# Patient Record
Sex: Male | Born: 1960 | Race: White | Hispanic: No | Smoking: Former smoker
Health system: Southern US, Community
[De-identification: ages and names within clinical notes are randomized; demographics above are authoritative.]

## PROBLEM LIST (undated history)

## (undated) DIAGNOSIS — I639 Cerebral infarction, unspecified: Secondary | ICD-10-CM

## (undated) DIAGNOSIS — K219 Gastro-esophageal reflux disease without esophagitis: Secondary | ICD-10-CM

---

## 2005-08-23 ENCOUNTER — Encounter: Admission: RE | Admit: 2005-08-23 | Discharge: 2005-08-23 | Payer: Self-pay | Admitting: Family Medicine

## 2011-02-09 ENCOUNTER — Ambulatory Visit (INDEPENDENT_AMBULATORY_CARE_PROVIDER_SITE_OTHER): Payer: Self-pay

## 2011-02-09 DIAGNOSIS — J019 Acute sinusitis, unspecified: Secondary | ICD-10-CM

## 2011-02-26 ENCOUNTER — Ambulatory Visit: Payer: Self-pay | Admitting: Family Medicine

## 2011-02-26 VITALS — BP 110/67 | HR 81 | Temp 97.9°F | Resp 16 | Ht 70.75 in | Wt 205.2 lb

## 2011-02-26 DIAGNOSIS — M109 Gout, unspecified: Secondary | ICD-10-CM

## 2011-02-26 LAB — COMPREHENSIVE METABOLIC PANEL
Albumin: 4.4 g/dL (ref 3.5–5.2)
Alkaline Phosphatase: 60 U/L (ref 39–117)
BUN: 18 mg/dL (ref 6–23)
Calcium: 10.1 mg/dL (ref 8.4–10.5)
Chloride: 103 mEq/L (ref 96–112)
Glucose, Bld: 97 mg/dL (ref 70–99)
Potassium: 4.9 mEq/L (ref 3.5–5.3)
Sodium: 140 mEq/L (ref 135–145)
Total Protein: 7.4 g/dL (ref 6.0–8.3)

## 2011-02-26 MED ORDER — COLCHICINE 0.6 MG PO TABS: ORAL_TABLET | ORAL | Status: DC

## 2011-02-26 NOTE — Progress Notes (Signed)
  Subjective:    Patient ID: Peter Ellis, male    DOB: 1960/10/18, 51 y.o.   MRN: [id number]  HPI 51 year old white male, normal weight, works as a Curator. Yesterday he developed a cute painful swelling and redness in the right ankle, mostly anteriorly. This is the same as he has had with previous attacks of gout. He has been having gout since he was about 51 years old. He has been treated with Indocin in the past, and has had colchicine. However he had some GI bleeding after taking the Indocin this last time around, and was told that it was probably from that. He stopped the medication and his stomach has done fine since that time. He has not been on long-term antigout uric acid lowering medication.   Review of Systems no other complaints a brief review of systems     Objective:   Physical Exam  No acute distress except for the foot which is hurting him to it is obviously swollen in the ankle, from the lateral t to the medial  malleolus. He is read across the top and primarily proximal portion of his foot. Toes are not painful. The ankle an area of erythema are tender.      Assessment & Plan:  Acute gouty arthritis Indocin (NSAID) intolerant with GI upset and GI bleeding in history  We'll check labs on him will treat acutely with colchicine and prednisone. If the labs come back with a high uric acid like I suspect, we will put him on some allopurinol. He is to get back to me if he does not hear from me on that.

## 2011-02-26 NOTE — Patient Instructions (Signed)
Gout Gout is an inflammatory condition (arthritis) caused by a buildup of uric acid crystals in the joints. Uric acid is a chemical that is normally present in the blood. Under some circumstances, uric acid can form into crystals in your joints. This causes joint redness, soreness, and swelling (inflammation). Repeat attacks are common. Over time, uric acid crystals can form into masses (tophi) near a joint, causing disfigurement. Gout is treatable and often preventable. CAUSES  The disease begins with elevated levels of uric acid in the blood. Uric acid is produced by your body when it breaks down a naturally found substance called purines. This also happens when you eat certain foods such as meats and fish. Causes of an elevated uric acid level include:  Being passed down from parent to child (heredity).   Diseases that cause increased uric acid production (obesity, psoriasis, some cancers).   Excessive alcohol use.   Diet, especially diets rich in meat and seafood.   Medicines, including certain cancer-fighting drugs (chemotherapy), diuretics, and aspirin.   Chronic kidney disease. The kidneys are no longer able to remove uric acid well.   Problems with metabolism.  Conditions strongly associated with gout include:  Obesity.   High blood pressure.   High cholesterol.   Diabetes.  Not everyone with elevated uric acid levels gets gout. It is not understood why some people get gout and others do not. Surgery, joint injury, and eating too much of certain foods are some of the factors that can lead to gout. SYMPTOMS   An attack of gout comes on quickly. It causes intense pain with redness, swelling, and warmth in a joint.   Fever can occur.   Often, only one joint is involved. Certain joints are more commonly involved:   Base of the big toe.   Knee.   Ankle.   Wrist.   Finger.  Without treatment, an attack usually goes away in a few days to weeks. Between attacks, you  usually will not have symptoms, which is different from many other forms of arthritis. DIAGNOSIS  Your caregiver will suspect gout based on your symptoms and exam. Removal of fluid from the joint (arthrocentesis) is done to check for uric acid crystals. Your caregiver will give you a medicine that numbs the area (local anesthetic) and use a needle to remove joint fluid for exam. Gout is confirmed when uric acid crystals are seen in joint fluid, using a special microscope. Sometimes, blood, urine, and X-ray tests are also used. TREATMENT  There are 2 phases to gout treatment: treating the sudden onset (acute) attack and preventing attacks (prophylaxis). Treatment of an Acute Attack  Medicines are used. These include anti-inflammatory medicines or steroid medicines.   An injection of steroid medicine into the affected joint is sometimes necessary.   The painful joint is rested. Movement can worsen the arthritis.   You may use warm or cold treatments on painful joints, depending which works best for you.   Discuss the use of coffee, vitamin C, or cherries with your caregiver. These may be helpful treatment options.  Treatment to Prevent Attacks After the acute attack subsides, your caregiver may advise prophylactic medicine. These medicines either help your kidneys eliminate uric acid from your body or decrease your uric acid production. You may need to stay on these medicines for a very long time. The early phase of treatment with prophylactic medicine can be associated with an increase in acute gout attacks. For this reason, during the first few months   of treatment, your caregiver may also advise you to take medicines usually used for acute gout treatment. Be sure you understand your caregiver's directions. You should also discuss dietary treatment with your caregiver. Certain foods such as meats and fish can increase uric acid levels. Other foods such as dairy can decrease levels. Your caregiver  can give you a list of foods to avoid. HOME CARE INSTRUCTIONS   Do not take aspirin to relieve pain. This raises uric acid levels.   Only take over-the-counter or prescription medicines for pain, discomfort, or fever as directed by your caregiver.   Rest the joint as much as possible. When in bed, keep sheets and blankets off painful areas.   Keep the affected joint raised (elevated).   Use crutches if the painful joint is in your leg.   Drink enough water and fluids to keep your urine clear or pale yellow. This helps your body get rid of uric acid. Do not drink alcoholic beverages. They slow the passage of uric acid.   Follow your caregiver's dietary instructions. Pay careful attention to the amount of protein you eat. Your daily diet should emphasize fruits, vegetables, whole grains, and fat-free or low-fat milk products.   Maintain a healthy body weight.  SEEK MEDICAL CARE IF:   You have an oral temperature above 102 F (38.9 C).   You develop diarrhea, vomiting, or any side effects from medicines.   You do not feel better in 24 hours, or you are getting worse.  SEEK IMMEDIATE MEDICAL CARE IF:   Your joint becomes suddenly more tender and you have:   Chills.   An oral temperature above 102 F (38.9 C), not controlled by medicine.  MAKE SURE YOU:   Understand these instructions.   Will watch your condition.   Will get help right away if you are not doing well or get worse.  Document Released: 12/30/1999 Document Revised: 09/13/2010 Document Reviewed: 04/11/2009 [employer] Patient Information 2012 [employer]. 

## 2011-03-02 ENCOUNTER — Encounter: Payer: Self-pay | Admitting: Family Medicine

## 2011-03-27 ENCOUNTER — Ambulatory Visit: Payer: Self-pay | Admitting: Family Medicine

## 2011-03-27 DIAGNOSIS — M79609 Pain in unspecified limb: Secondary | ICD-10-CM

## 2011-03-27 DIAGNOSIS — M109 Gout, unspecified: Secondary | ICD-10-CM | POA: Insufficient documentation

## 2011-03-27 MED ORDER — ALLOPURINOL 100 MG PO TABS: 100.0000 mg | ORAL_TABLET | Freq: Every day | ORAL | Status: DC

## 2011-03-27 MED ORDER — PREDNISONE 20 MG PO TABS: ORAL_TABLET | ORAL | Status: DC

## 2011-03-27 NOTE — Patient Instructions (Signed)
Gout Gout is an inflammatory condition (arthritis) caused by a buildup of uric acid crystals in the joints. Uric acid is a chemical that is normally present in the blood. Under some circumstances, uric acid can form into crystals in your joints. This causes joint redness, soreness, and swelling (inflammation). Repeat attacks are common. Over time, uric acid crystals can form into masses (tophi) near a joint, causing disfigurement. Gout is treatable and often preventable. CAUSES  The disease begins with elevated levels of uric acid in the blood. Uric acid is produced by your body when it breaks down a naturally found substance called purines. This also happens when you eat certain foods such as meats and fish. Causes of an elevated uric acid level include:  Being passed down from parent to child (heredity).   Diseases that cause increased uric acid production (obesity, psoriasis, some cancers).   Excessive alcohol use.   Diet, especially diets rich in meat and seafood.   Medicines, including certain cancer-fighting drugs (chemotherapy), diuretics, and aspirin.   Chronic kidney disease. The kidneys are no longer able to remove uric acid well.   Problems with metabolism.  Conditions strongly associated with gout include:  Obesity.   High blood pressure.   High cholesterol.   Diabetes.  Not everyone with elevated uric acid levels gets gout. It is not understood why some people get gout and others do not. Surgery, joint injury, and eating too much of certain foods are some of the factors that can lead to gout. SYMPTOMS   An attack of gout comes on quickly. It causes intense pain with redness, swelling, and warmth in a joint.   Fever can occur.   Often, only one joint is involved. Certain joints are more commonly involved:   Base of the big toe.   Knee.   Ankle.   Wrist.   Finger.  Without treatment, an attack usually goes away in a few days to weeks. Between attacks, you  usually will not have symptoms, which is different from many other forms of arthritis. DIAGNOSIS  Your caregiver will suspect gout based on your symptoms and exam. Removal of fluid from the joint (arthrocentesis) is done to check for uric acid crystals. Your caregiver will give you a medicine that numbs the area (local anesthetic) and use a needle to remove joint fluid for exam. Gout is confirmed when uric acid crystals are seen in joint fluid, using a special microscope. Sometimes, blood, urine, and X-ray tests are also used. TREATMENT  There are 2 phases to gout treatment: treating the sudden onset (acute) attack and preventing attacks (prophylaxis). Treatment of an Acute Attack  Medicines are used. These include anti-inflammatory medicines or steroid medicines.   An injection of steroid medicine into the affected joint is sometimes necessary.   The painful joint is rested. Movement can worsen the arthritis.   You may use warm or cold treatments on painful joints, depending which works best for you.   Discuss the use of coffee, vitamin C, or cherries with your caregiver. These may be helpful treatment options.  Treatment to Prevent Attacks After the acute attack subsides, your caregiver may advise prophylactic medicine. These medicines either help your kidneys eliminate uric acid from your body or decrease your uric acid production. You may need to stay on these medicines for a very long time. The early phase of treatment with prophylactic medicine can be associated with an increase in acute gout attacks. For this reason, during the first few months   of treatment, your caregiver may also advise you to take medicines usually used for acute gout treatment. Be sure you understand your caregiver's directions. You should also discuss dietary treatment with your caregiver. Certain foods such as meats and fish can increase uric acid levels. Other foods such as dairy can decrease levels. Your caregiver  can give you a list of foods to avoid. HOME CARE INSTRUCTIONS   Do not take aspirin to relieve pain. This raises uric acid levels.   Only take over-the-counter or prescription medicines for pain, discomfort, or fever as directed by your caregiver.   Rest the joint as much as possible. When in bed, keep sheets and blankets off painful areas.   Keep the affected joint raised (elevated).   Use crutches if the painful joint is in your leg.   Drink enough water and fluids to keep your urine clear or pale yellow. This helps your body get rid of uric acid. Do not drink alcoholic beverages. They slow the passage of uric acid.   Follow your caregiver's dietary instructions. Pay careful attention to the amount of protein you eat. Your daily diet should emphasize fruits, vegetables, whole grains, and fat-free or low-fat milk products.   Maintain a healthy body weight.  SEEK MEDICAL CARE IF:   You have an oral temperature above 102 F (38.9 C).   You develop diarrhea, vomiting, or any side effects from medicines.   You do not feel better in 24 hours, or you are getting worse.  SEEK IMMEDIATE MEDICAL CARE IF:   Your joint becomes suddenly more tender and you have:   Chills.   An oral temperature above 102 F (38.9 C), not controlled by medicine.  MAKE SURE YOU:   Understand these instructions.   Will watch your condition.   Will get help right away if you are not doing well or get worse.  Document Released: 12/30/1999 Document Revised: 12/21/2010 Document Reviewed: 04/11/2009 [facility] Patient Information 2012 [place], [place].  If any dark stools, or abdominal pain- return to clinic or emergency room.  Recheck in 3 months.  Return to the clinic or go to the nearest emergency room if any of your symptoms worsen or new symptoms occur.

## 2011-03-27 NOTE — Progress Notes (Signed)
  Subjective:    Patient ID: Peter Ellis, male    DOB: Mar 23, 1960, 51 y.o.   MRN: [id number]  HPI Finis Hendricksen is a 51 y.o. male Hx Gout x 20 years.  Last attack 1 month ago, took colcrys then  - caused upset stomach, and then dark stools.  Hx of GI bleed with Indocin.  Took colcrys last week as well and noticed dark stools, but stools normal. Has been taking 2 at 1st dose, then 2 more later in day, then 1 following day.  Does take pepto bismol with Colcrys. (has taken pepto each time the stools are black).  Would like different med than colcrys due to GI intolerance.  Has taken prednisone in past without difficulty. Uric acid 8.3 02/27/11.  5 or 6 gout attacks this year, even with diet changes.   Current sx's since last night.  R foot/ankle sore and slightly swollen since last night.  Not as bad as in past, but feels typical of gout symptoms. Tx: none  Glucose WNL a last ov.  Review of Systems  Constitutional: Negative for fever and chills.  Gastrointestinal: Negative for abdominal pain, blood in stool and anal bleeding.  Musculoskeletal: Positive for arthralgias.        Objective:   Physical Exam  Constitutional: He is oriented to person, place, and time. He appears well-developed and well-nourished.  HENT:  Head: Normocephalic and atraumatic.  Cardiovascular: Normal rate, regular rhythm, normal heart sounds and intact distal pulses.   Pulmonary/Chest: Effort normal.  Abdominal: Soft. There is no tenderness.  Musculoskeletal: He exhibits edema.       Right ankle: He exhibits decreased range of motion and swelling. He exhibits no ecchymosis and no deformity. no tenderness.  Neurological: He is alert and oriented to person, place, and time.  Skin: Skin is warm. There is erythema.       Faint erythema dorsum R ankle only.   Psychiatric: He has a normal mood and affect. His behavior is normal.          Assessment & Plan:  Peter Ellis is a 51 y.o. male Gout- recurrent  with increased attack frequency.  Possible GI bleed with Indocin, but suspect dark stools with pepto bismol use when taking colcrys, not true GIB with colcrys.  However, does have significant Gi distress with colcrys.  Start prednisone 20 mg 3, 3,2,2, 1, 1, 1/2, 1/2.  SED.  RTC if any dark stools or abd pain. In 2 weeks, can start Allopurinol - SED/precautions reviewed.  RTC next 3 months for recheck uric acid.

## 2011-04-06 ENCOUNTER — Other Ambulatory Visit: Payer: Self-pay | Admitting: Physician Assistant

## 2011-04-06 ENCOUNTER — Ambulatory Visit: Payer: Self-pay | Admitting: Emergency Medicine

## 2011-04-06 ENCOUNTER — Ambulatory Visit: Payer: Self-pay

## 2011-04-06 VITALS — BP 118/66 | HR 75 | Temp 98.5°F | Resp 18 | Ht 71.0 in | Wt 208.0 lb

## 2011-04-06 DIAGNOSIS — M109 Gout, unspecified: Secondary | ICD-10-CM

## 2011-04-06 DIAGNOSIS — L02419 Cutaneous abscess of limb, unspecified: Secondary | ICD-10-CM

## 2011-04-06 DIAGNOSIS — M25569 Pain in unspecified knee: Secondary | ICD-10-CM

## 2011-04-06 DIAGNOSIS — M25469 Effusion, unspecified knee: Secondary | ICD-10-CM

## 2011-04-06 DIAGNOSIS — L03119 Cellulitis of unspecified part of limb: Secondary | ICD-10-CM

## 2011-04-06 MED ORDER — DOXYCYCLINE HYCLATE 100 MG PO CAPS: 100.0000 mg | ORAL_CAPSULE | Freq: Two times a day (BID) | ORAL | Status: AC

## 2011-04-06 MED ORDER — HYDROCODONE-ACETAMINOPHEN 5-325 MG PO TABS: 1.0000 | ORAL_TABLET | Freq: Four times a day (QID) | ORAL | Status: AC | PRN

## 2011-04-06 MED ORDER — MELOXICAM 7.5 MG PO TABS: 7.5000 mg | ORAL_TABLET | Freq: Two times a day (BID) | ORAL | Status: DC

## 2011-04-06 NOTE — Patient Instructions (Addendum)
Hold off on the Colrys while you take the meloxicam.  We will contact you with the lab results.  If your pain, swelling worsen, or if you develop fever, redness at the site of the needle, return for re-evaluation promptly.

## 2011-04-06 NOTE — Progress Notes (Signed)
  Subjective:    Patient ID: Peter Peter, male    DOB: Jun 01, 1960, 51 y.o.   MRN: [id number]  HPI  Air squats on Wednesday night.  Awoke Thursday with left knee pain and swelling.  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  Also, has been getting "boils" on his thighs for the past several months.  They drain pus and then resolve.  Has two currently on the right anterior distal thigh, a resolving lesion on the right upper posterior thigh. .  Review of Systems     Objective:   Physical Exam  VS noted.  Awake, alert, oriented, NAD.   Left knee is swollen, very tender, generalized.  Two large erythematous raised lesions (3 cm and 1.5 cm) on the right anterior distal thigh.  Tender.  Indurated with central fluctuance.Scab over each is lifted with the dull edge of a blade and purulence is expressed.    [facility] reading (PRIMARY) by  Dr. Cleta Peter. Small bone island noted in the medial tibial plateau. Joint space preserved.  See Dr. Ellis Peter note regarding aspiration of the knee effusion.  Patient was counselled extensively regarding the increased risk of infection given the probable MRSA infection on the other leg.     Assessment & Plan:  Knee pain, swelling with effusion, probable gout. Cellulitis/abscess, thigh.  Crutches, doxycycline, meloxicam, and Norco.  Await wound culture, and synovial fluid analysis (crystals, culture and gram stain).

## 2011-04-06 NOTE — Progress Notes (Signed)
  Subjective:    Patient ID: Peter Ellis, male    DOB: [birth year], 51 y.o.   MRN: [id number]  HPI  Review of Systems     Objective:   Physical Exam   [facility] reading (PRIMARY) by  Dr.Izekiel Flegel x-ray shows a bone island over the tibial plateau  The medial knee was prepped with Betadine and numbed with 2 cc of 2% plain 60 cc of cloudy fluid was removed. This was not bloody fluid sent for crystals and culture.     Assessment & Plan:

## 2011-04-07 LAB — SYNOVIAL FLUID, CRYSTAL

## 2011-04-09 LAB — WOUND CULTURE
Gram Stain: NONE SEEN
Gram Stain: NONE SEEN

## 2011-04-10 LAB — BODY FLUID CULTURE: Organism ID, Bacteria: NO GROWTH

## 2011-04-12 ENCOUNTER — Encounter: Payer: Self-pay | Admitting: Physician Assistant

## 2011-04-12 ENCOUNTER — Ambulatory Visit: Payer: BC Managed Care – PPO | Admitting: Physician Assistant

## 2011-04-12 VITALS — BP 140/62 | HR 80 | Temp 98.0°F | Resp 16 | Ht 71.0 in | Wt 210.0 lb

## 2011-04-12 DIAGNOSIS — R110 Nausea: Secondary | ICD-10-CM

## 2011-04-12 MED ORDER — PROMETHAZINE HCL 25 MG PO TABS: 25.0000 mg | ORAL_TABLET | Freq: Three times a day (TID) | ORAL | Status: DC | PRN

## 2011-04-12 NOTE — Patient Instructions (Signed)
Return to clinic if fever/chills or abdominal pain develop. If current symptoms don't resolve in 24-36 hrs, return to clinic.

## 2011-04-12 NOTE — Progress Notes (Signed)
  Subjective:    Patient ID: Peter Ellis, male    DOB: 14-Jan-1961, 51 y.o.   MRN: [id number]  HPI51yo CM c/o 2 episodes of diarrhea this morning and he has continued to feel nauseated all day without vomiting.  He did take some pepto bismol which seemed to help a little.  No f/c.  No abd. Pain.  No recent travel.    Review of Systems  All other systems reviewed and are negative.       Objective:   Physical Exam  Nursing note and vitals reviewed. Constitutional: He is oriented to person, place, and time. He appears well-developed and well-nourished.  HENT:  Head: Normocephalic and atraumatic.  Cardiovascular: Normal rate, regular rhythm and normal heart sounds.   Pulmonary/Chest: Effort normal and breath sounds normal.  Abdominal: Soft. He exhibits no distension and no mass. There is no tenderness. There is no rebound and no guarding.       Increased bowel sounds but sound normal.  Neurological: He is alert and oriented to person, place, and time.  Skin: Skin is warm and dry.          Assessment & Plan:  Likely viral syndrome-mild diarrhea and nausea w/o vomiting. Increase fluids, rest.  Phenergan sent to pharmacy.

## 2011-05-06 ENCOUNTER — Ambulatory Visit: Payer: BC Managed Care – PPO | Admitting: Emergency Medicine

## 2011-05-06 VITALS — BP 144/79 | HR 76 | Temp 98.5°F | Resp 16 | Ht 71.0 in | Wt 207.0 lb

## 2011-05-06 DIAGNOSIS — R1013 Epigastric pain: Secondary | ICD-10-CM

## 2011-05-06 MED ORDER — FLUTICASONE PROPIONATE 50 MCG/ACT NA SUSP: 2.0000 | Freq: Every day | NASAL | Status: DC

## 2011-05-06 MED ORDER — RANITIDINE HCL 150 MG PO TABS: 150.0000 mg | ORAL_TABLET | Freq: Two times a day (BID) | ORAL | Status: DC

## 2011-05-06 NOTE — Progress Notes (Signed)
  Subjective:    Patient ID: Peter Ellis, male    DOB: [birth year], 51 y.o.   MRN: [id number]  HPI patient states that approximately 3 weeks ago he started having difficulty with a lot of saliva building up in the back of his mouth. He felt a popping sensation in his years. He denies sore throat. He states he did not have any chest pain or shortness of breath or abdominal pain. He tried some Gaviscon without relief he is only history is one of gout. He states when he takes a colchicine it does bother his stomach he has been on anti-inflammatories in the past. He is currently on allopurinol.    Review of Systems review of systems is positive mainly for history of gout.     Objective:   Physical Exam HEENT exam is within normal limits the neck is supple chest is clear to auscultation and percussion. Heart regular rate no murmurs. Abdomen is soft liver and spleen not enlarged no tenderness or masses.  EKG shows a normal sinus rhythm. There is J-point elevation inferiorly.      Assessment & Plan:  Not quite sure how to put patient's symptoms together. We'll try Zantac twice a day. Also trial of Flonase 2 puffs each side of his nose once a day. This should stop any postnasal drip related to allergies. We'll make sure he stays off nonsteroidals or anything else that might irritate his stomach

## 2011-05-06 NOTE — Patient Instructions (Signed)
To stop all nonsteroidal drugs. Try the Zantac and Flonase. Please review the instructions regarding refluxGastroesophageal Reflux Disease, Adult Gastroesophageal reflux disease (GERD) happens when acid from your stomach flows up into the esophagus. When acid comes in contact with the esophagus, the acid causes soreness (inflammation) in the esophagus. Over time, GERD may create small holes (ulcers) in the lining of the esophagus. CAUSES   Increased body weight. This puts pressure on the stomach, making acid rise from the stomach into the esophagus.   Smoking. This increases acid production in the stomach.   Drinking alcohol. This causes decreased pressure in the lower esophageal sphincter (valve or ring of muscle between the esophagus and stomach), allowing acid from the stomach into the esophagus.   Late evening meals and a full stomach. This increases pressure and acid production in the stomach.   A malformed lower esophageal sphincter.  Sometimes, no cause is found. SYMPTOMS   Burning pain in the lower part of the mid-chest behind the breastbone and in the mid-stomach area. This may occur twice a week or more often.   Trouble swallowing.   Sore throat.   Dry cough.   Asthma-like symptoms including chest tightness, shortness of breath, or wheezing.  DIAGNOSIS  Your caregiver may be able to diagnose GERD based on your symptoms. In some cases, X-rays and other tests may be done to check for complications or to check the condition of your stomach and esophagus. TREATMENT  Your caregiver may recommend over-the-counter or prescription medicines to help decrease acid production. Ask your caregiver before starting or adding any new medicines.  HOME CARE INSTRUCTIONS   Change the factors that you can control. Ask your caregiver for guidance concerning weight loss, quitting smoking, and alcohol consumption.   Avoid foods and drinks that make your symptoms worse, such as:   Caffeine or  alcoholic drinks.   Chocolate.   Peppermint or mint flavorings.   Garlic and onions.   Spicy foods.   Citrus fruits, such as oranges, lemons, or limes.   Tomato-based foods such as sauce, chili, salsa, and pizza.   Fried and fatty foods.   Avoid lying down for the 3 hours prior to your bedtime or prior to taking a nap.   Eat small, frequent meals instead of large meals.   Wear loose-fitting clothing. Do not wear anything tight around your waist that causes pressure on your stomach.   Raise the head of your bed 6 to 8 inches with wood blocks to help you sleep. Extra pillows will not help.   Only take over-the-counter or prescription medicines for pain, discomfort, or fever as directed by your caregiver.   Do not take aspirin, ibuprofen, or other nonsteroidal anti-inflammatory drugs (NSAIDs).  SEEK IMMEDIATE MEDICAL CARE IF:   You have pain in your arms, neck, jaw, teeth, or back.   Your pain increases or changes in intensity or duration.   You develop nausea, vomiting, or sweating (diaphoresis).   You develop shortness of breath, or you faint.   Your vomit is green, yellow, black, or looks like coffee grounds or blood.   Your stool is red, bloody, or black.  These symptoms could be signs of other problems, such as heart disease, gastric bleeding, or esophageal bleeding. MAKE SURE YOU:   Understand these instructions.   Will watch your condition.   Will get help right away if you are not doing well or get worse.  Document Released: 10/11/2004 Document Revised: 12/21/2010 Document Reviewed: 07/21/2010 [employer]  Patient Information 2012 [employer]. 

## 2011-05-07 ENCOUNTER — Telehealth: Payer: Self-pay

## 2011-05-07 NOTE — Telephone Encounter (Signed)
Spoke with patient and let him know he should give the meds two to three days to work and if pains get worse to rtc.  Patient stated that he understood and would rtc if he gets worse.

## 2011-05-07 NOTE — Telephone Encounter (Signed)
Pt called and states was seen by dr Cleta Alberts, and prescribed zantac. Pt wants to let the dr know he took 1 at 3:00 yesterday and again at 8:30 yesterday, but is having stomach pains. Wants to know what to do. Please call pt to advise

## 2011-05-07 NOTE — Telephone Encounter (Signed)
It may take 2-3 days for the Zantac to work, he should take it twice daily as prescribed. This will help with mild gastritis/heartburn.   If the stomach pains worsen he should return to clinic.

## 2011-05-07 NOTE — Telephone Encounter (Signed)
Please advise on this.  

## 2011-05-11 ENCOUNTER — Ambulatory Visit: Payer: BC Managed Care – PPO | Admitting: Family Medicine

## 2011-05-11 VITALS — BP 135/67 | HR 81 | Temp 98.1°F | Resp 16 | Ht 70.5 in | Wt 204.8 lb

## 2011-05-11 DIAGNOSIS — K219 Gastro-esophageal reflux disease without esophagitis: Secondary | ICD-10-CM

## 2011-05-11 DIAGNOSIS — M109 Gout, unspecified: Secondary | ICD-10-CM

## 2011-05-11 MED ORDER — METHYLPREDNISOLONE ACETATE 80 MG/ML IJ SUSP
80.0000 mg | Freq: Once | INTRAMUSCULAR | Status: AC
  Administered 2011-05-11: 80 mg via INTRAMUSCULAR

## 2011-05-11 MED ORDER — PREDNISONE 20 MG PO TABS: ORAL_TABLET | ORAL | Status: DC

## 2011-05-11 NOTE — Patient Instructions (Signed)
Gout Gout is an inflammatory condition (arthritis) caused by a buildup of uric acid crystals in the joints. Uric acid is a chemical that is normally present in the blood. Under some circumstances, uric acid can form into crystals in your joints. This causes joint redness, soreness, and swelling (inflammation). Repeat attacks are common. Over time, uric acid crystals can form into masses (tophi) near a joint, causing disfigurement. Gout is treatable and often preventable. CAUSES  The disease begins with elevated levels of uric acid in the blood. Uric acid is produced by your body when it breaks down a naturally found substance called purines. This also happens when you eat certain foods such as meats and fish. Causes of an elevated uric acid level include:  Being passed down from parent to child (heredity).   Diseases that cause increased uric acid production (obesity, psoriasis, some cancers).   Excessive alcohol use.   Diet, especially diets rich in meat and seafood.   Medicines, including certain cancer-fighting drugs (chemotherapy), diuretics, and aspirin.   Chronic kidney disease. The kidneys are no longer able to remove uric acid well.   Problems with metabolism.  Conditions strongly associated with gout include:  Obesity.   High blood pressure.   High cholesterol.   Diabetes.  Not everyone with elevated uric acid levels gets gout. It is not understood why some people get gout and others do not. Surgery, joint injury, and eating too much of certain foods are some of the factors that can lead to gout. SYMPTOMS   An attack of gout comes on quickly. It causes intense pain with redness, swelling, and warmth in a joint.   Fever can occur.   Often, only one joint is involved. Certain joints are more commonly involved:   Base of the big toe.   Knee.   Ankle.   Wrist.   Finger.  Without treatment, an attack usually goes away in a few days to weeks. Between attacks, you  usually will not have symptoms, which is different from many other forms of arthritis. DIAGNOSIS  Your caregiver will suspect gout based on your symptoms and exam. Removal of fluid from the joint (arthrocentesis) is done to check for uric acid crystals. Your caregiver will give you a medicine that numbs the area (local anesthetic) and use a needle to remove joint fluid for exam. Gout is confirmed when uric acid crystals are seen in joint fluid, using a special microscope. Sometimes, blood, urine, and X-ray tests are also used. TREATMENT  There are 2 phases to gout treatment: treating the sudden onset (acute) attack and preventing attacks (prophylaxis). Treatment of an Acute Attack  Medicines are used. These include anti-inflammatory medicines or steroid medicines.   An injection of steroid medicine into the affected joint is sometimes necessary.   The painful joint is rested. Movement can worsen the arthritis.   You may use warm or cold treatments on painful joints, depending which works best for you.   Discuss the use of coffee, vitamin C, or cherries with your caregiver. These may be helpful treatment options.  Treatment to Prevent Attacks After the acute attack subsides, your caregiver may advise prophylactic medicine. These medicines either help your kidneys eliminate uric acid from your body or decrease your uric acid production. You may need to stay on these medicines for a very long time. The early phase of treatment with prophylactic medicine can be associated with an increase in acute gout attacks. For this reason, during the first few months   of treatment, your caregiver may also advise you to take medicines usually used for acute gout treatment. Be sure you understand your caregiver's directions. You should also discuss dietary treatment with your caregiver. Certain foods such as meats and fish can increase uric acid levels. Other foods such as dairy can decrease levels. Your caregiver  can give you a list of foods to avoid. HOME CARE INSTRUCTIONS   Do not take aspirin to relieve pain. This raises uric acid levels.   Only take over-the-counter or prescription medicines for pain, discomfort, or fever as directed by your caregiver.   Rest the joint as much as possible. When in bed, keep sheets and blankets off painful areas.   Keep the affected joint raised (elevated).   Use crutches if the painful joint is in your leg.   Drink enough water and fluids to keep your urine clear or pale yellow. This helps your body get rid of uric acid. Do not drink alcoholic beverages. They slow the passage of uric acid.   Follow your caregiver's dietary instructions. Pay careful attention to the amount of protein you eat. Your daily diet should emphasize fruits, vegetables, whole grains, and fat-free or low-fat milk products.   Maintain a healthy body weight.  SEEK MEDICAL CARE IF:   You have an oral temperature above 102 F (38.9 C).   You develop diarrhea, vomiting, or any side effects from medicines.   You do not feel better in 24 hours, or you are getting worse.  SEEK IMMEDIATE MEDICAL CARE IF:   Your joint becomes suddenly more tender and you have:   Chills.   An oral temperature above 102 F (38.9 C), not controlled by medicine.  MAKE SURE YOU:   Understand these instructions.   Will watch your condition.   Will get help right away if you are not doing well or get worse.  Document Released: 12/30/1999 Document Revised: 12/21/2010 Document Reviewed: 04/11/2009 [employer] Patient Information 2012 [employer]. 

## 2011-05-11 NOTE — Progress Notes (Signed)
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  He just recently got on the allopurinol. Left wrist began to swell and ache yesterday after some discomfort for a week.  The left knee has also started to flare in the past 24 hours.  Patient was seen last week for GERD.  O: Left wrist is very swollen as is the left knee Antalgic gait NAD  A:  Acute gouty flare in patient with sensitive stomach  P:  DepoMedrol 120 mg Prednisone taper.

## 2011-05-15 ENCOUNTER — Ambulatory Visit: Payer: BC Managed Care – PPO | Admitting: Family Medicine

## 2011-05-15 VITALS — BP 133/78 | HR 81 | Temp 98.1°F | Resp 18 | Ht 70.5 in | Wt 201.8 lb

## 2011-05-15 DIAGNOSIS — L0390 Cellulitis, unspecified: Secondary | ICD-10-CM

## 2011-05-15 DIAGNOSIS — M79609 Pain in unspecified limb: Secondary | ICD-10-CM

## 2011-05-15 DIAGNOSIS — H6690 Otitis media, unspecified, unspecified ear: Secondary | ICD-10-CM

## 2011-05-15 MED ORDER — DOXYCYCLINE HYCLATE 100 MG PO TABS: 100.0000 mg | ORAL_TABLET | Freq: Two times a day (BID) | ORAL | Status: AC

## 2011-05-15 NOTE — Progress Notes (Signed)
  Subjective:    Patient ID: Peter Ellis, male    DOB: 1960-11-29, 51 y.o.   MRN: [id number]  HPI    Review of Systems     Objective:   Physical Exam   Procedure Note:  VCO.  Left leg wound cleansed with betadine and alcohol.  2cc of 1% lidocaine with epinephrine injected and #11 blade used to open.  Moderate amount of yellow/red purulence expressed, irrigated with 3 cc of lidocaine and 1/4 inch packing (small piece) placed in wound.  Telfa, gauze, hypofix applied.  Pt tolerated well.  Peter Steig, PA-C     Assessment & Plan:  Abcess/celllulitis of left leg.  Wound instructions given pt.  Advised RTC in 2 days for wound care, fast pass given.  Peter Slough, PA-C

## 2011-05-15 NOTE — Progress Notes (Signed)
  Subjective:    Patient ID: Peter Ellis, male    DOB: 10-07-1960, 51 y.o.   MRN: [id number]  HPI 51 yo male with history of abscesses here with new abscess to left knee.  Last one month ago was MRSA. This one there about 7-10 days.  Was there when he was here for gout flare last week but didn't bring it up.  Was given steroid injection and since has worsened.  Feeling okay.   Review of Systems Negative except as per HPI     Objective:   Physical Exam  Constitutional: He appears well-developed.  Pulmonary/Chest: Effort normal.  Neurological: He is alert.  Skin:       Medial left knee - scabbed over fluctuant abscess with significant surrounding erythema.  TTP.  Wound culture obtained from drianing fluid          Assessment & Plan:  Abscess and cellulitis - opened per procedure note.  Doxy 100 BID for 10 days.

## 2011-05-17 ENCOUNTER — Ambulatory Visit (INDEPENDENT_AMBULATORY_CARE_PROVIDER_SITE_OTHER): Payer: BC Managed Care – PPO | Admitting: Internal Medicine

## 2011-05-17 VITALS — BP 117/72 | HR 73 | Temp 98.2°F | Resp 16 | Ht 70.5 in | Wt 200.0 lb

## 2011-05-17 DIAGNOSIS — L03119 Cellulitis of unspecified part of limb: Secondary | ICD-10-CM

## 2011-05-17 DIAGNOSIS — L02419 Cutaneous abscess of limb, unspecified: Secondary | ICD-10-CM

## 2011-05-17 DIAGNOSIS — L03116 Cellulitis of left lower limb: Secondary | ICD-10-CM

## 2011-05-17 NOTE — Progress Notes (Signed)
  Subjective:    Patient ID: Ajani Rineer, male    DOB: 1960-05-12, 51 y.o.   MRN: [id number]  HPI  Tyhir is here for a wound check on his left leg cellulitis.  He is having much less pain and redness, he has not changed his outside dressing.  He is taking his doxycycline without problems.  See previous note.    Review of Systems  All other systems reviewed and are negative.  Negative except as noted in HPI     Objective:   Physical Exam  Vitals reviewed. Constitutional: He is oriented to person, place, and time. He appears well-developed and well-nourished.  Neurological: He is alert and oriented to person, place, and time.  Procedure Note:  VCO.  Outer bandage removed with some foul smelling purulence expressed when packing removed.  Wound irrigated with 3 cc of 1% lidocaine and repacked with 1/4 inch tape (small piece). Firm induration still present around wound opening but erythema significantly decreased.   Telfa, gauze, and hypofix applied.  Wound bed still has yellow purulence so patient encourage to RTC in 2 days for recheck.        Assessment & Plan:  Left leg abcess, improved.  Continue medication, advised pt to change exterior bandage daily!  RTC 2 days.

## 2011-05-19 ENCOUNTER — Ambulatory Visit (INDEPENDENT_AMBULATORY_CARE_PROVIDER_SITE_OTHER): Payer: BC Managed Care – PPO | Admitting: Physician Assistant

## 2011-05-19 VITALS — BP 132/78 | HR 68 | Temp 98.0°F | Resp 16 | Ht 71.0 in | Wt 200.0 lb

## 2011-05-19 DIAGNOSIS — L03119 Cellulitis of unspecified part of limb: Secondary | ICD-10-CM

## 2011-05-19 DIAGNOSIS — IMO0002 Reserved for concepts with insufficient information to code with codable children: Secondary | ICD-10-CM

## 2011-05-19 LAB — WOUND CULTURE: Gram Stain: NONE SEEN

## 2011-05-19 NOTE — Patient Instructions (Signed)
Apply a warm compress to the area for 15-30 minutes 3 times daily to increase blood flow to the area.

## 2011-05-19 NOTE — Progress Notes (Signed)
Packing removed.  Small amount of purulent discharge expressed.  Large amount of necrotic eschar with undermining present in around wound edges. Wound irrigated with 2 cc 2% lidocaine.  Portions of necrotic eschar removed. Wound packed aggressively with 1/2" plain packing. Cleansed and dressed.

## 2011-05-19 NOTE — Progress Notes (Signed)
Presents for wound care. Cellulitis/abscess of the medial left knee, s/p I&D 05/15/2011.  Was seen for wound care 05/17/2011.  Reports feeling much better.  Continues to tolerate Doxycycline.  Participated in wound care as documented by Ms. Harris.  Re-evaluate tomorrow.  May need repeat anesthesia and more substantial debridement with sharps.

## 2011-05-20 ENCOUNTER — Ambulatory Visit (INDEPENDENT_AMBULATORY_CARE_PROVIDER_SITE_OTHER): Payer: BC Managed Care – PPO | Admitting: Physician Assistant

## 2011-05-20 VITALS — BP 121/73 | HR 74 | Temp 98.1°F | Resp 16 | Ht 71.0 in | Wt 200.0 lb

## 2011-05-20 DIAGNOSIS — L02419 Cutaneous abscess of limb, unspecified: Secondary | ICD-10-CM

## 2011-05-20 DIAGNOSIS — IMO0002 Reserved for concepts with insufficient information to code with codable children: Secondary | ICD-10-CM

## 2011-05-20 DIAGNOSIS — L03119 Cellulitis of unspecified part of limb: Secondary | ICD-10-CM

## 2011-05-20 NOTE — Progress Notes (Signed)
  Subjective:    Patient ID: Peter Ellis, male    DOB: 1960-10-27, 51 y.o.   MRN: [id number]  HPI  Presents for wound care.  Cellulitis/abscess medial left knee, s/p I&D 05/15/11.  Please see previous notes. Seen yesterday and noted to have considerable necrotic tissue in the wound.  Erythema and induration much improved from initial evaluation. Eschar removed with forceps as tolerated by the patient and re-packed, though anticipated the need for local anesthesia and sharp debridement today.  He continues to tolerate the Doxycycline without difficulty.  Mild soreness, but more like his gout pain than the pain he initially had with the cellulitis.  Review of Systems As above.    Objective:   Physical Exam VS noted. A&Ox3. Dressing removed.  Saturated gauze and packing removed.  Decreased but persistent necrosis, very thick along the posterior border.  Mild induration surrounding wound at the posterior aspect as well.  No increased erythema. Wound bed irrigated with 2% lidocaine plain.  Necrosis debrided with sharps, to bloody tissue in some areas.  Considerable debulking achieved. Aggressive packing with 1/2 inch plain packing, to help with additional debridement upon removal. Dressed with Telfa, gauze and Hypafix.     Assessment & Plan:   1. Abscess or cellulitis of knee    Continue Doxycycline. Continue warm compresses. RTC tomorrow for wound care.  I do not anticipate the need for significant debridement tomorrow, given the improvement seen today.

## 2011-05-21 ENCOUNTER — Ambulatory Visit (INDEPENDENT_AMBULATORY_CARE_PROVIDER_SITE_OTHER): Payer: BC Managed Care – PPO | Admitting: Physician Assistant

## 2011-05-21 VITALS — BP 111/69 | HR 77 | Temp 98.5°F | Resp 16 | Ht 71.0 in | Wt 200.0 lb

## 2011-05-21 DIAGNOSIS — L03119 Cellulitis of unspecified part of limb: Secondary | ICD-10-CM

## 2011-05-21 DIAGNOSIS — L02419 Cutaneous abscess of limb, unspecified: Secondary | ICD-10-CM

## 2011-05-21 NOTE — Progress Notes (Signed)
  Subjective:    Patient ID: Peter Ellis, male    DOB: Dec 05, 1960, 51 y.o.   MRN: [id number]  HPI   Presents for wound care.  Cellulitis/abscess medial left knee, s/p I&D 05/15/11.  See previous notes. Seen yesterday with persistent but improved necrotic tissue.  Erythema and induration improved. Area anesthetized, eschar debrided with forceps and curved hemostats. Continues to tolerate Doxycycline.  Mild soreneness. Denies fever, nausea, myalgia, or chills.   Review of Systems  As above.    Objective:   Physical Exam  VS noted. A&Ox3. Dressing removed.  Saturated gauze and packing removed.  Decreased but persistent necrosis. Area of necrotic undermining in the superior lateral position of the wound. Mild induration surrounding wound at the posterior aspect as well.  No increased erythema. Wound bed irrigated with 2% lidocaine plain. Further anesthetized with 3cc 2% lidocaine plain. Necrosis debrided with sharps and curved hemostats to bloody tissue in some areas.  Considerable debulking achieved. Aggressive packing with 1/2 inch plain packing, to help with additional debridement upon removal. Dressed with Telfa, gauze and Hypafix.     Assessment & Plan:   No diagnosis found. Continue Doxycycline. Continue warm compresses. RTC tomorrow for wound care.  I do not anticipate the need for significant debridement tomorrow, given the improvement seen today.

## 2011-05-21 NOTE — Progress Notes (Signed)
I have examined this patient along with the student and agree.  

## 2011-05-22 ENCOUNTER — Ambulatory Visit (INDEPENDENT_AMBULATORY_CARE_PROVIDER_SITE_OTHER): Payer: BC Managed Care – PPO | Admitting: Physician Assistant

## 2011-05-22 ENCOUNTER — Encounter: Payer: Self-pay | Admitting: Physician Assistant

## 2011-05-22 VITALS — BP 121/76 | HR 76 | Temp 98.5°F | Resp 20 | Ht 71.0 in | Wt 201.0 lb

## 2011-05-22 DIAGNOSIS — L02419 Cutaneous abscess of limb, unspecified: Secondary | ICD-10-CM

## 2011-05-22 NOTE — Progress Notes (Signed)
  Subjective:    Patient ID: Peter Ellis, male    DOB: [birth year], 51 y.o.   MRN: [id number]  HPI   Presents for wound care.  Cellulitis/abscess medial left knee, s/p I&D 05/15/11.  See previous notes. Seen yesterday with persistent but improved necrotic tissue.  Erythema and induration improved.Continues to tolerate Doxycycline.   Mild soreneness. Denies fever, nausea, myalgia, or chills.   Review of Systems  As above.    Objective:   Physical Exam  VS noted. A&Ox3. Dressing removed.  Saturated gauze and packing removed.  Minimal necrotic eschar noted, no debridement necessary, no purulent dishcarge expressed.  No increased erythema. Wound bed irrigated with 2% lidocaine plain. Packed with 1/2 inch plain packing. Dressed with Telfa, gauze and Hypafix.     Assessment & Plan:   1. Cellulitis and abscess of knee    Continue Doxycycline. Continue warm compresses. RTC tomorrow for wound care.

## 2011-05-22 NOTE — Progress Notes (Deleted)
  Subjective:    Patient ID: Peter Ellis, male    DOB: 01/15/1961, 51 y.o.   MRN: [id number]  HPI    Review of Systems     Objective:   Physical Exam        Assessment & Plan:

## 2011-05-22 NOTE — Progress Notes (Signed)
I have examined this patient along with the student and agree.  

## 2011-05-23 ENCOUNTER — Ambulatory Visit (INDEPENDENT_AMBULATORY_CARE_PROVIDER_SITE_OTHER): Payer: BC Managed Care – PPO | Admitting: Physician Assistant

## 2011-05-23 VITALS — BP 120/75 | HR 79 | Temp 98.0°F | Resp 16 | Ht 71.0 in | Wt 200.0 lb

## 2011-05-23 DIAGNOSIS — L02419 Cutaneous abscess of limb, unspecified: Secondary | ICD-10-CM

## 2011-05-23 DIAGNOSIS — L02416 Cutaneous abscess of left lower limb: Secondary | ICD-10-CM

## 2011-05-23 NOTE — Progress Notes (Signed)
  Subjective:    Patient ID: Peter Ellis, male    DOB: 1960/12/23, 51 y.o.   MRN: [id number]  HPI Peter Ellis comes today for wound recheck, s/p I&D abscess left medial knee 05/08/11.  He is being seen every day for debridement and packing change.  He states he is doing well with little pain.  Tolerating medication.  No fever or chills.   Review of Systems As above    Objective:   Physical Exam  Left medial knee  Dressing removed.  Packing intact.  Induration is less than 05/21/11 office visit but still present.  No new area of erythema. Packing removed.  Yellow/green exudate noted on gauze.  Wound irrigated with Lidocaine 2% plain.  Minimal new eschar.  Distal portion of wound edge with 2 small isolated necrotic areas. Debrided any eschar but was minimal.  Wound repacked with 1/4" plain.  Cleansed and redressed. Patient tolerated this well.      Assessment & Plan:  Abscess, left knee  Continue wound care and finish antibiotic. Recommend he return in 24-48 hours to encourage packing to agitate wound base more.  He will likely return Friday.

## 2011-05-25 ENCOUNTER — Ambulatory Visit (INDEPENDENT_AMBULATORY_CARE_PROVIDER_SITE_OTHER): Payer: BC Managed Care – PPO | Admitting: Emergency Medicine

## 2011-05-25 VITALS — BP 146/80 | HR 68 | Temp 97.7°F | Resp 16 | Ht 71.0 in | Wt 205.0 lb

## 2011-05-25 DIAGNOSIS — K2970 Gastritis, unspecified, without bleeding: Secondary | ICD-10-CM

## 2011-05-25 DIAGNOSIS — K219 Gastro-esophageal reflux disease without esophagitis: Secondary | ICD-10-CM

## 2011-05-25 DIAGNOSIS — K9433 Esophagostomy malfunction: Secondary | ICD-10-CM

## 2011-05-25 DIAGNOSIS — L02419 Cutaneous abscess of limb, unspecified: Secondary | ICD-10-CM

## 2011-05-25 DIAGNOSIS — K2990 Gastroduodenitis, unspecified, without bleeding: Secondary | ICD-10-CM

## 2011-05-25 DIAGNOSIS — L03119 Cellulitis of unspecified part of limb: Secondary | ICD-10-CM

## 2011-05-25 MED ORDER — SUCRALFATE 1 GM/10ML PO SUSP: 1.0000 g | Freq: Four times a day (QID) | ORAL | Status: DC

## 2011-05-25 NOTE — Progress Notes (Signed)
  Subjective:    Patient ID: Peter Ellis, male    DOB: Apr 14, 1960, 51 y.o.   MRN: [id number]  HPI seen recently with gastritis. We felt this was probably secondary to high doses of indomethacin he was taken for gout. He has had some improvement with Zantac. He continues to have intermittent dyspepsia but overall is doing better.    Review of Systems     Objective:   Physical Exam HEENT exam is unremarkable. Chest is clear heart regular rate no murmurs abdomen is soft there is no tenderness in the upper abdomen.        Assessment & Plan:  I still feel his symptoms most likely triggered by use of indomethacin. We'll go ahead and add Carafate:  he can take this with his Zantac. He.

## 2011-05-25 NOTE — Progress Notes (Signed)
In addition to his complaint today of dyspepsia, he needs wound care.  He has a wound on the medial left knee after I&D of a MRSA cellulitis 05/15/2011.  The course has been complicated by significant necrosis requiring sharps debridement, but since then the wound has been granulating in nicely.  The dressing has been removed.  Saturated packing removed without incident.  The surrounding tissue appears pink, but healthy.  Minimal induration.  No purulence. Some exudative material noted, but minimal.  Wound irrigated with 1 cc of plain lidocaine, 1%.  Gently packed with 1/4 inch plain packing.  Dressed with Telfa, gauze and Hypafix.  Re-evaluate with Ms. Warrick, PA-C on 05/27/2011.

## 2011-05-27 ENCOUNTER — Ambulatory Visit (INDEPENDENT_AMBULATORY_CARE_PROVIDER_SITE_OTHER): Payer: BC Managed Care – PPO | Admitting: Physician Assistant

## 2011-05-27 VITALS — BP 122/72 | HR 65 | Temp 97.9°F | Resp 16 | Wt 202.0 lb

## 2011-05-27 DIAGNOSIS — L0291 Cutaneous abscess, unspecified: Secondary | ICD-10-CM

## 2011-05-27 DIAGNOSIS — L0390 Cellulitis, unspecified: Secondary | ICD-10-CM

## 2011-05-27 NOTE — Progress Notes (Signed)
  Subjective:    Patient ID: Peter Ellis, male    DOB: Jan 24, 1960, 51 y.o.   MRN: [id number]  HPI Peter Ellis is here today for wound recheck and dressing change s/p MRSA abscess I & D left medial knee area performed 05/11/11.  He is doing very well with little pain. States the packing came out last night.  No fever or chills.  No new lesions.   Review of Systems As above   Objective:   Physical Exam Left medial knee  Dressing removed.  No packing in wound but is adhered to dressing.  Wound base with moist exudate.  No new necrotic areas.  Induration is slightly less with healthy wound edges than before.  Irrigated with Lidocaine plain 2%, 5 cc.  Wound bed agitated and small amount of eschar removed.  Repacked. Cleansed and dressed.       Assessment & Plan:  Abscess, left knee area  Wound care reviewed.  Return 48 hours to see Porfirio Oar, PA-C for wound recheck and dressing change.

## 2011-05-29 ENCOUNTER — Ambulatory Visit (INDEPENDENT_AMBULATORY_CARE_PROVIDER_SITE_OTHER): Payer: BC Managed Care – PPO | Admitting: Physician Assistant

## 2011-05-29 VITALS — BP 98/63 | HR 94 | Temp 97.9°F | Resp 16 | Ht 71.0 in | Wt 203.8 lb

## 2011-05-29 DIAGNOSIS — L02419 Cutaneous abscess of limb, unspecified: Secondary | ICD-10-CM

## 2011-05-29 DIAGNOSIS — IMO0002 Reserved for concepts with insufficient information to code with codable children: Secondary | ICD-10-CM

## 2011-05-29 DIAGNOSIS — M109 Gout, unspecified: Secondary | ICD-10-CM

## 2011-05-29 MED ORDER — MELOXICAM 15 MG PO TABS: 15.0000 mg | ORAL_TABLET | Freq: Every day | ORAL | Status: AC

## 2011-05-29 NOTE — Progress Notes (Signed)
Presents for re-evaluation of cellulitis/abscess of the left knee s/p I&D 05/15/2011.  He's had considerable necrosis requiring sharps debridement. Since then he's done very well with significant improvement.  His course is complicated by a flare of gout in the left wrist.  He's started Allopurinol, and has eliminated the use of NSAIDS due to dyspepsia.  His most recent flare was treated with IM and oral prednisone, and he asks for that again.  O: VS noted.  A&O.   Dressing from the left medial knee removed.  Packing removed.  No induration.  Non-tender.  No purulence expressed.  The wound bed is shallow, with small yellow exudative material. Gently packed with small length of 1/2 inch plain packing and dressed. The left wrist is mildly edematous, without edema or erythema.  Tender to light touch and ROM.  Strong pulses.  Normal temperature.  No rash or ecchymosis.  A/P: 1. Cellulitis/abscess medial left knee.  Continue warm compresses.  Re-evaluate in 48 hours.  Possible that it will not require additional packing.  2. Gout, wrist.  Lengthy discussion of treatment options, risks and benefits.  He is very disappointed that I am not giving him prednisone today.  Given the difficulty we've had with his wound, I'm concerned about an immunosuppressant.  Additionally, given his recent dyspepsia, prednisone poses as much risk as the oral NSAIDS.  We'll use meloxicam and the hydrocodone he has at home for the next 48 hours, and then reassess.  If his wound is significantly improved in 48 hours, I'll prescribe prednisone if his wrist is not much better.  Once his acute flare is resolved, we'll increase the allopurinol dose to 200 mg.  He is agreeable, but frustrated.

## 2011-05-29 NOTE — Patient Instructions (Signed)
Continue the warm compresses to the knee.  If the knee is much better on Friday, and your wrist is not significantly improved, we'll be able to use prednisone to treat it.  If your wrist is improved, we'll go ahead and increase the dose of allopurinol to 200 mg.

## 2011-05-31 ENCOUNTER — Ambulatory Visit (INDEPENDENT_AMBULATORY_CARE_PROVIDER_SITE_OTHER): Payer: BC Managed Care – PPO | Admitting: Physician Assistant

## 2011-05-31 ENCOUNTER — Encounter: Payer: Self-pay | Admitting: Physician Assistant

## 2011-05-31 VITALS — BP 109/69 | HR 80 | Temp 98.0°F | Resp 16 | Ht 71.5 in | Wt 205.0 lb

## 2011-05-31 DIAGNOSIS — L02419 Cutaneous abscess of limb, unspecified: Secondary | ICD-10-CM

## 2011-05-31 DIAGNOSIS — M109 Gout, unspecified: Secondary | ICD-10-CM

## 2011-05-31 DIAGNOSIS — L03119 Cellulitis of unspecified part of limb: Secondary | ICD-10-CM

## 2011-05-31 NOTE — Progress Notes (Signed)
  Subjective:    Patient ID: Peter Ellis, male    DOB: 1960/06/22, 51 y.o.   MRN: [id number]  HPI Presents for re-evaluation of a wound on the left medial knee, s/p I&D 05/15/2011.  He's had a complicated course, please see previous notes.  He's also had a recent flare of gout, this time in the left wrist. Meloxicam has helped a lot, but he's still uncomfortable.  The knee feels fine, without pain.   Review of Systems     Objective:   Physical Exam Vital signs noted. Well-developed, well nourished WM who is awake, alert and oriented, in NAD. HEENT: [place]/AT, slera and conjunctiva are clear.  Extremities: no cyanosis, clubbing. Minimal edema and tenderness of the left wrist. Skin: warm and dry without rash. Knee wound is shallow.  No edema, erythema, induration.  Bandage applied.     Assessment & Plan:   1. Cellulitis of knee   2. Gout   Local wound care.  Recheck wound in 1 week, sooner if ANY increased pain, swelling, redness develop.  At that visit, we'll increase the allopurinol to 200 mg.

## 2011-06-08 ENCOUNTER — Ambulatory Visit (INDEPENDENT_AMBULATORY_CARE_PROVIDER_SITE_OTHER): Payer: BC Managed Care – PPO | Admitting: Physician Assistant

## 2011-06-08 DIAGNOSIS — M109 Gout, unspecified: Secondary | ICD-10-CM

## 2011-06-08 DIAGNOSIS — L03119 Cellulitis of unspecified part of limb: Secondary | ICD-10-CM

## 2011-06-08 MED ORDER — ALLOPURINOL 100 MG PO TABS: 200.0000 mg | ORAL_TABLET | Freq: Every day | ORAL | Status: DC

## 2011-06-08 NOTE — Progress Notes (Signed)
  Subjective:    Patient ID: Peter Ellis, male    DOB: May 09, 1960, 51 y.o.   MRN: [id number]  HPI  Presents for wound care and gout follow-up.  The lesion on the knee is healing by secondary intention.  He's washing it daily and keeping it covered at work.  No pain.  No redness or swelling.  The gout flare in the wrist is much improved, though he still has a dull ache for which he uses meloxicam.  We discussed increasing his allopurinol dose once the acute flare was resolves and he would like to proceed with that plan.  Review of Systems As above.    Objective:   Physical Exam  Vs noted.  A&Ox4. Knee wound is well granulated.  No erythema, edema or drainage.     Assessment & Plan:   1. Gout  allopurinol (ZYLOPRIM) 100 MG tablet, increase to 2 PO QD.  Check uric acid level in 4 weeks and consider increasing dose to 300 mg.  2. Cellulitis of knee  Continue local wound care.

## 2011-06-12 ENCOUNTER — Ambulatory Visit: Payer: BC Managed Care – PPO | Admitting: Family Medicine

## 2011-06-12 VITALS — BP 116/71 | HR 75 | Temp 98.5°F | Resp 16 | Ht 70.5 in | Wt 209.0 lb

## 2011-06-12 DIAGNOSIS — K5289 Other specified noninfective gastroenteritis and colitis: Secondary | ICD-10-CM

## 2011-06-12 DIAGNOSIS — R110 Nausea: Secondary | ICD-10-CM

## 2011-06-12 MED ORDER — ONDANSETRON 4 MG PO TBDP: 4.0000 mg | ORAL_TABLET | Freq: Three times a day (TID) | ORAL | Status: AC | PRN

## 2011-06-12 NOTE — Progress Notes (Signed)
  Subjective:    Patient ID: Peter Ellis, male    DOB: [birth year], 51 y.o.   MRN: [id number]  HPI Comments: Developed nausea yesterday evening.  No diarrhea or vomiting.  No melena.  No abdominal pain.  Had similar sxs associated with acid reflux. Finished doxycycline 10 day course a couple of weeks ago.  Using meloxicam and allopurinol.  allipurinol increased to 200 mg from 100 mg 4 days ago.  Off prednisone for several weeks.  No fevers or chills.  Not using any meds for his nausea. Carafat has not helped as much and he has been out since last week.  Has used gaviscon with little relief.  Prilosec was recommended in the past.  He is using zantac.  Had normal cmet in Feb. Of this year.       Review of Systems     Objective:   Physical Exam  Constitutional: He is oriented to person, place, and time. He appears well-developed and well-nourished.  Cardiovascular: Normal rate, regular rhythm and normal heart sounds.   Abdominal: Soft. Bowel sounds are normal. He exhibits no distension and no mass. There is tenderness. There is no rebound and no guarding.       Mild midepigastric TTP  Neurological: He is alert and oriented to person, place, and time.  Skin: Skin is warm and dry.          Assessment & Plan:   1. Nausea  Comprehensive metabolic panel, ondansetron (ZOFRAN ODT) 4 MG disintegrating tablet  2. GERD  Prilosec OTC 1 tab po bid x 1 week, then qd for total of 6 weeks    F/u prn    Stop meloxicam for now

## 2011-06-12 NOTE — Patient Instructions (Signed)
Nausea, Adult Nausea is the feeling that you have an upset stomach or have to vomit. Nausea by itself is not likely a serious concern, but it may be an early sign of more serious medical problems. As nausea gets worse, it can lead to vomiting. If vomiting develops, there is the risk of dehydration.  CAUSES   Viral infections.   Food poisoning.   Medicines.   Pregnancy.   Motion sickness.   Migraine headaches.   Emotional distress.   Severe pain from any source.   Alcohol intoxication.  HOME CARE INSTRUCTIONS  Get plenty of rest.   Ask your caregiver about specific rehydration instructions.   Eat small amounts of food and sip liquids more often.   Take all medicines as told by your caregiver.  SEEK MEDICAL CARE IF:  You have not improved after 2 days, or you get worse.   You have a headache.  SEEK IMMEDIATE MEDICAL CARE IF:   You have a fever.   You faint.   You keep vomiting or have blood in your vomit.   You are extremely weak or dehydrated.   You have dark or bloody stools.   You have severe chest or abdominal pain.  MAKE SURE YOU:  Understand these instructions.   Will watch your condition.   Will get help right away if you are not doing well or get worse.  Document Released: 02/09/2004 Document Revised: 12/21/2010 Document Reviewed: 09/13/2010 ExitCare Patient Information 2012 [employer]. 

## 2011-06-13 LAB — COMPREHENSIVE METABOLIC PANEL
Albumin: 4.4 g/dL (ref 3.5–5.2)
BUN: 11 mg/dL (ref 6–23)
CO2: 25 mEq/L (ref 19–32)
Glucose, Bld: 89 mg/dL (ref 70–99)
Potassium: 4.1 mEq/L (ref 3.5–5.3)
Sodium: 139 mEq/L (ref 135–145)
Total Protein: 6.8 g/dL (ref 6.0–8.3)

## 2011-07-15 ENCOUNTER — Ambulatory Visit: Payer: BC Managed Care – PPO

## 2011-07-15 ENCOUNTER — Ambulatory Visit: Payer: BC Managed Care – PPO | Admitting: Emergency Medicine

## 2011-07-15 VITALS — BP 102/65 | HR 68 | Temp 98.0°F | Resp 16 | Ht 71.0 in | Wt 202.4 lb

## 2011-07-15 DIAGNOSIS — R109 Unspecified abdominal pain: Secondary | ICD-10-CM

## 2011-07-15 DIAGNOSIS — M549 Dorsalgia, unspecified: Secondary | ICD-10-CM

## 2011-07-15 MED ORDER — RANITIDINE HCL 150 MG PO TABS: 150.0000 mg | ORAL_TABLET | Freq: Two times a day (BID) | ORAL | Status: DC

## 2011-07-15 NOTE — Progress Notes (Signed)
  Subjective:    Patient ID: Peter Ellis, male    DOB: 09-Nov-1960, 51 y.o.   MRN: [id number]  HPI stomach seems to be doing better. Suggested endoscopy if not better. Never had a colonoscopy, could refer for colon.  Christmas bleeding from indomethacin,  unknown if screened for  Helicobacter, seems to be doing better, especially last sat and Sunday, not taking steroidal only allopurinol,   Stress at work possible,   Light alcohol use. 150 zantac bid used currently.  Pain in shoulder blade earlier in week. Pulls in left  Upper back pain when looking down.    Review of Systems     Objective:   Physical Exam  Constitutional: He appears well-developed.  HENT:  Head: Normocephalic.  Neck:       There is tenderness along the left periscapular area there is pain in the neck with full flexion and extension. Deep tendon reflexes in the upper extremities are 2+. Motor strength in the upper extremities is 5 out of 5.  Cardiovascular: Normal rate and regular rhythm.   Pulmonary/Chest: Effort normal and breath sounds normal.  Abdominal:       The abdomen is flat bowel sounds are normal there is no area of tenderness in the upper abdomen   [facility] reading (PRIMARY) by  Dr. Cleta Alberts C-spine films are normal. There is an accentuated curve but otherwise negative         Assessment & Plan:  Pain around the left shoulder blade but probably musculoskeletal spine films are normal the patient is now 51 he needs both screening of the upper and lower GI tract. Will make referral for evaluation .

## 2011-07-27 ENCOUNTER — Telehealth: Payer: Self-pay

## 2011-07-27 NOTE — Telephone Encounter (Signed)
Please get more information.

## 2011-07-27 NOTE — Telephone Encounter (Signed)
Pt went to his appt with dr hung but was not satisfied and would like to talk with someone about referring him to a different GI provider    Best number [phone]

## 2011-07-29 NOTE — Telephone Encounter (Signed)
Spoke with pt he feels like Dr Elnoria Howard didn't do much of anything. He didn't exam him and he didn't explain much to him. He stated he was doing much better and would call us if he needed to see another GI doctor.

## 2011-08-02 ENCOUNTER — Telehealth: Payer: Self-pay

## 2011-08-02 DIAGNOSIS — R109 Unspecified abdominal pain: Secondary | ICD-10-CM

## 2011-08-02 NOTE — Telephone Encounter (Signed)
Referral done

## 2011-08-02 NOTE — Telephone Encounter (Signed)
LMOM for patient

## 2011-08-02 NOTE — Telephone Encounter (Signed)
Can we refer to a different GI specialist?

## 2011-08-02 NOTE — Telephone Encounter (Signed)
Pt called back stating that he has spoken to tamara earlier in the week about having a different referral to a gi office that he did not have a good experience with dr hung, and he was feeling better thinking he did not need to go back but he is starting to feel bad and would like a new gi referral  Best number [phone]

## 2011-08-21 ENCOUNTER — Telehealth: Payer: Self-pay

## 2011-08-21 NOTE — Telephone Encounter (Signed)
Angel from [facility] would like patient's last OV records and lab work faxed for pt's visit with them next Monday.  Fax#[phone]

## 2011-08-22 NOTE — Telephone Encounter (Signed)
Last office visit faxed via Epic to [facility].

## 2011-09-03 ENCOUNTER — Ambulatory Visit: Payer: BC Managed Care – PPO | Admitting: Emergency Medicine

## 2011-09-03 VITALS — BP 98/60 | HR 70 | Temp 98.4°F | Resp 14 | Ht 71.0 in | Wt 204.0 lb

## 2011-09-03 DIAGNOSIS — IMO0002 Reserved for concepts with insufficient information to code with codable children: Secondary | ICD-10-CM

## 2011-09-03 DIAGNOSIS — L03119 Cellulitis of unspecified part of limb: Secondary | ICD-10-CM

## 2011-09-03 DIAGNOSIS — L02419 Cutaneous abscess of limb, unspecified: Secondary | ICD-10-CM

## 2011-09-03 MED ORDER — CLINDAMYCIN HCL 300 MG PO CAPS: 300.0000 mg | ORAL_CAPSULE | Freq: Four times a day (QID) | ORAL | Status: AC

## 2011-09-03 NOTE — Progress Notes (Signed)
Date:  09/03/2011   Name:  Peter Ellis   DOB:  [birth year]   MRN:  [id number] Gender: male  Age: 51 y.o.  PCP:  Lucilla Edin, MD    Chief Complaint: Cyst   History of Present Illness:  Peter Ellis is a 50 y.o. pleasant patient who presents with the following:  History of prior abscess growing MRSA.  Now comes with three abscesses; one of right forearm, left forearm, and left posterior thigh.  No fever or chills, systemic symptoms.  Patient Active Problem List  Diagnosis  . Gout  . GERD (gastroesophageal reflux disease)    Past Medical History  Diagnosis Date  . Gout     No past surgical history on file.  History  Substance Use Topics  . Smoking status: Former Smoker    Quit date: 05/22/2010  . Smokeless tobacco: Not on file  . Alcohol Use: Not on file    No family history on file.  Allergies  Allergen Reactions  . Colchicine (Colchicine) Other (See Comments)    Nausea and black stools  . Indomethacin Other (See Comments)    Gi problems  . Amoxicillin Rash  . Sulfa Antibiotics Rash    Medication list has been reviewed and updated.  Current Outpatient Prescriptions on File Prior to Visit  Medication Sig Dispense Refill  . ranitidine (ZANTAC) 150 MG tablet Take 1 tablet (150 mg total) by mouth 2 (two) times daily.  60 tablet  5  . allopurinol (ZYLOPRIM) 100 MG tablet Take 2 tablets (200 mg total) by mouth daily.  60 tablet  5  . doxycycline (VIBRA-TABS) 100 MG tablet Take 100 mg by mouth Twice daily.      . fluticasone (FLONASE) 50 MCG/ACT nasal spray Place 2 sprays into the nose daily.  16 g  6  . meloxicam (MOBIC) 15 MG tablet Take 1 tablet (15 mg total) by mouth daily.  30 tablet  0  . predniSONE (DELTASONE) 20 MG tablet Take two daily for 5 days, then 1 tablet daily until gone (always take with food)  15 tablet  0  . promethazine (PHENERGAN) 25 MG tablet Take 1 tablet (25 mg total) by mouth every 8 (eight) hours as needed for nausea.  20 tablet   0  . sucralfate (CARAFATE) 1 GM/10ML suspension Take 10 mLs (1 g total) by mouth 4 (four) times daily.  420 mL  0    Review of Systems:  As per HPI, otherwise negative.    Physical Examination: Filed Vitals:   09/03/11 1631  BP: 98/60  Pulse: 70  Temp: 98.4 F (36.9 C)  Resp: 14   Filed Vitals:   09/03/11 1631  Height: 5\' 11"  (1.803 m)  Weight: 204 lb (92.534 kg)   Body mass index is 28.45 kg/(m^2). Ideal Body Weight: Weight in (lb) to have BMI = 25: 178.9    GEN: WDWN, NAD, Non-toxic, Alert & Oriented x 3 HEENT: Atraumatic, Normocephalic.  Ears and Nose: No external deformity. EXTR: No clubbing/cyanosis/edema NEURO: Normal gait.  PSYCH: Normally interactive. Conversant. Not depressed or anxious appearing.  Calm demeanor.  Skin:  Three abscesses; one on right forearm that is resolving, another on his left forearm that has developed since and is tender but not fluctuant, and a third on his left thigh that he has drained and is now indurated and not fluctuant.  Assessment and Plan: MRSA Abscesses arms and thigh clindaymcin Follow up as needed  Discussed treatment plan with patient.  He will use local heat and return for I&D if there is no progress Thursday  Carmelina Dane, MD

## 2011-11-13 ENCOUNTER — Encounter (HOSPITAL_BASED_OUTPATIENT_CLINIC_OR_DEPARTMENT_OTHER): Payer: Self-pay | Admitting: *Deleted

## 2011-11-13 ENCOUNTER — Emergency Department (HOSPITAL_BASED_OUTPATIENT_CLINIC_OR_DEPARTMENT_OTHER)
Admission: EM | Admit: 2011-11-13 | Discharge: 2011-11-13 | Disposition: A | Discharge status: Home / Self Care | Payer: BC Managed Care – PPO | Attending: Emergency Medicine | Admitting: Emergency Medicine

## 2011-11-13 ENCOUNTER — Emergency Department (HOSPITAL_BASED_OUTPATIENT_CLINIC_OR_DEPARTMENT_OTHER): Payer: BC Managed Care – PPO

## 2011-11-13 DIAGNOSIS — Z8739 Personal history of other diseases of the musculoskeletal system and connective tissue: Secondary | ICD-10-CM | POA: Insufficient documentation

## 2011-11-13 DIAGNOSIS — W208XXA Other cause of strike by thrown, projected or falling object, initial encounter: Secondary | ICD-10-CM | POA: Insufficient documentation

## 2011-11-13 DIAGNOSIS — S9030XA Contusion of unspecified foot, initial encounter: Secondary | ICD-10-CM | POA: Insufficient documentation

## 2011-11-13 DIAGNOSIS — Z87891 Personal history of nicotine dependence: Secondary | ICD-10-CM | POA: Insufficient documentation

## 2011-11-13 DIAGNOSIS — Y9229 Other specified public building as the place of occurrence of the external cause: Secondary | ICD-10-CM | POA: Insufficient documentation

## 2011-11-13 DIAGNOSIS — K219 Gastro-esophageal reflux disease without esophagitis: Secondary | ICD-10-CM | POA: Insufficient documentation

## 2011-11-13 DIAGNOSIS — Z79899 Other long term (current) drug therapy: Secondary | ICD-10-CM | POA: Insufficient documentation

## 2011-11-13 DIAGNOSIS — S9032XA Contusion of left foot, initial encounter: Secondary | ICD-10-CM

## 2011-11-13 DIAGNOSIS — Y990 Civilian activity done for income or pay: Secondary | ICD-10-CM | POA: Insufficient documentation

## 2011-11-13 HISTORY — DX: Gastro-esophageal reflux disease without esophagitis: K21.9

## 2011-11-13 NOTE — ED Provider Notes (Signed)
History     CSN: [id number]  Arrival date & time 11/13/11  1959   None     Chief Complaint  Patient presents with  . Foot Injury  . Ankle Injury    (Consider location/radiation/quality/duration/timing/severity/associated sxs/prior treatment) Patient is a 51 y.o. male presenting with ankle pain. The history is provided by the patient. A language interpreter was used.  Ankle Pain  The incident occurred less than 1 hour ago. The incident occurred at home. There was no injury mechanism. The pain is present in the left ankle and left foot. The quality of the pain is described as aching. The pain is at a severity of 5/10. The pain is moderate. The pain has been constant since onset. Associated symptoms include inability to bear weight. Nothing aggravates the symptoms. He has tried nothing for the symptoms.   Pt complains of swelling and pain in left foot and left ankle.  Pt dropped a gas tank on his foot on Friday at work. Past Medical History  Diagnosis Date  . Gout   . GERD (gastroesophageal reflux disease)     History reviewed. No pertinent past surgical history.  No family history on file.  History  Substance Use Topics  . Smoking status: Former Smoker    Quit date: 05/22/2010  . Smokeless tobacco: Not on file  . Alcohol Use: No      Review of Systems  Musculoskeletal: Negative for joint swelling.  All other systems reviewed and are negative.    Allergies  Colchicine; Indomethacin; Amoxicillin; and Sulfa antibiotics  Home Medications   Current Outpatient Rx  Name Route Sig Dispense Refill  . ALLOPURINOL 100 MG PO TABS Oral Take 2 tablets (200 mg total) by mouth daily. 60 tablet 5  . DOXYCYCLINE HYCLATE 100 MG PO TABS Oral Take 100 mg by mouth Twice daily.    Marland Kitchen FLUTICASONE PROPIONATE 50 MCG/ACT NA SUSP Nasal Place 2 sprays into the nose daily. 16 g 6  . MELOXICAM 15 MG PO TABS Oral Take 1 tablet (15 mg total) by mouth daily. 30 tablet 0  . PREDNISONE 20 MG PO  TABS  Take two daily for 5 days, then 1 tablet daily until gone (always take with food) 15 tablet 0  . PROMETHAZINE HCL 25 MG PO TABS Oral Take 1 tablet (25 mg total) by mouth every 8 (eight) hours as needed for nausea. 20 tablet 0  . RANITIDINE HCL 150 MG PO TABS Oral Take 1 tablet (150 mg total) by mouth 2 (two) times daily. 60 tablet 5  . SUCRALFATE 1 GM/10ML PO SUSP Oral Take 10 mLs (1 g total) by mouth 4 (four) times daily. 420 mL 0    BP 124/71  Pulse 90  Temp 98.5 F (36.9 C) (Oral)  Resp 18  Wt 195 lb (88.451 kg)  SpO2 99%  Physical Exam  Nursing note and vitals reviewed. Constitutional: He is oriented to person, place, and time. He appears well-developed and well-nourished.  Musculoskeletal:       Bruised toes,  Swollen left foot and left ankle  Neurological: He is alert and oriented to person, place, and time. He has normal reflexes.  Skin: Skin is warm.  Psychiatric: He has a normal mood and affect.    ED Course  Procedures (including critical care time)  Labs Reviewed - No data to display Dg Ankle Complete Left  11/13/2011  *RADIOLOGY REPORT*  Clinical Data: Ankle pain.  Blunt trauma to foot.  LEFT ANKLE COMPLETE -  3+ VIEW  Comparison: None.  Findings: Mild bimalleolar soft tissue swelling is present.  There is no acute osseous abnormality.  No significant joint effusion is present.  The joint is located.  IMPRESSION:  1.  Mild bimalleolar soft tissue swelling without an acute osseous abnormality.   Original Report Authenticated By: Jamesetta Orleans. MATTERN, M.D.    Dg Foot Complete Left  11/13/2011  *RADIOLOGY REPORT*  Clinical Data: Blunt trauma to the foot.  Pain across the metatarsals.  LEFT FOOT - COMPLETE 3+ VIEW  Comparison: None.  Findings: A hallux valgus deformity is noted.  No acute osseous abnormality is present.  There is no significant soft tissue injury.  A tiny plantar calcaneal spur is noted.  IMPRESSION:  1.  No acute abnormality. 2.  Hallux valgus  deformity.   Original Report Authenticated By: Jamesetta Orleans. MATTERN, M.D.      1. Contusion of left foot       MDM  No fx on xray.   Pt placed in a post op shoe and ace wrap.  Pt has crutches        Lonia Skinner [place], [place] 11/13/11 2156  Elson Areas, [place] 11/13/11 2200

## 2011-11-13 NOTE — ED Notes (Signed)
Pt sts he dropped a gas tank on his left foot/ankle 1 week ago and same remains stiff. Pt denies pain and is walking with a crutch.

## 2011-11-14 NOTE — ED Provider Notes (Signed)
History/physical exam/procedure(s) were performed by non-physician practitioner and as supervising physician I was immediately available for consultation/collaboration. I have reviewed all notes and am in agreement with care and plan.   Lateria Alderman S Dilon Lank, MD 11/14/11 0052 

## 2012-04-19 IMAGING — CR DG KNEE COMPLETE 4+V*L*
3 series · 3 of 3 positions shown · non-contrast
Comparison: None.

CLINICAL DATA: Left knee pain/swelling

LEFT KNEE - COMPLETE 4+ VIEW

[AP]
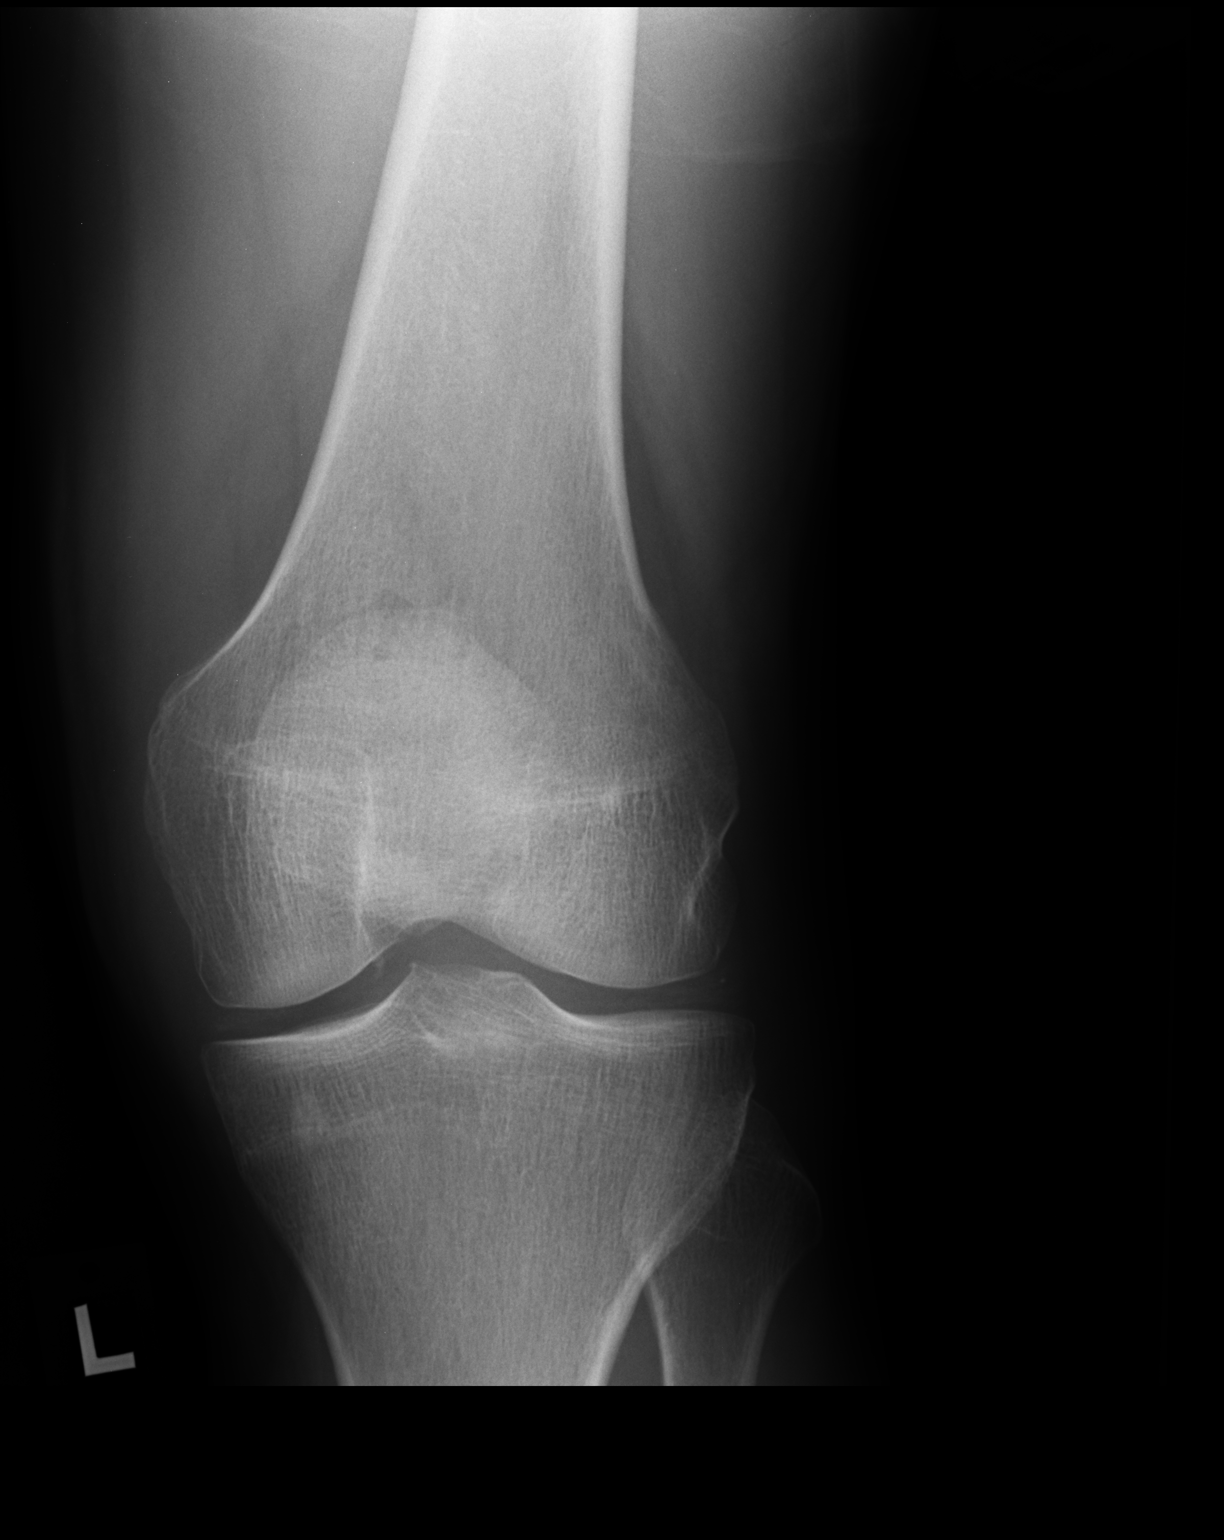

[lateral]
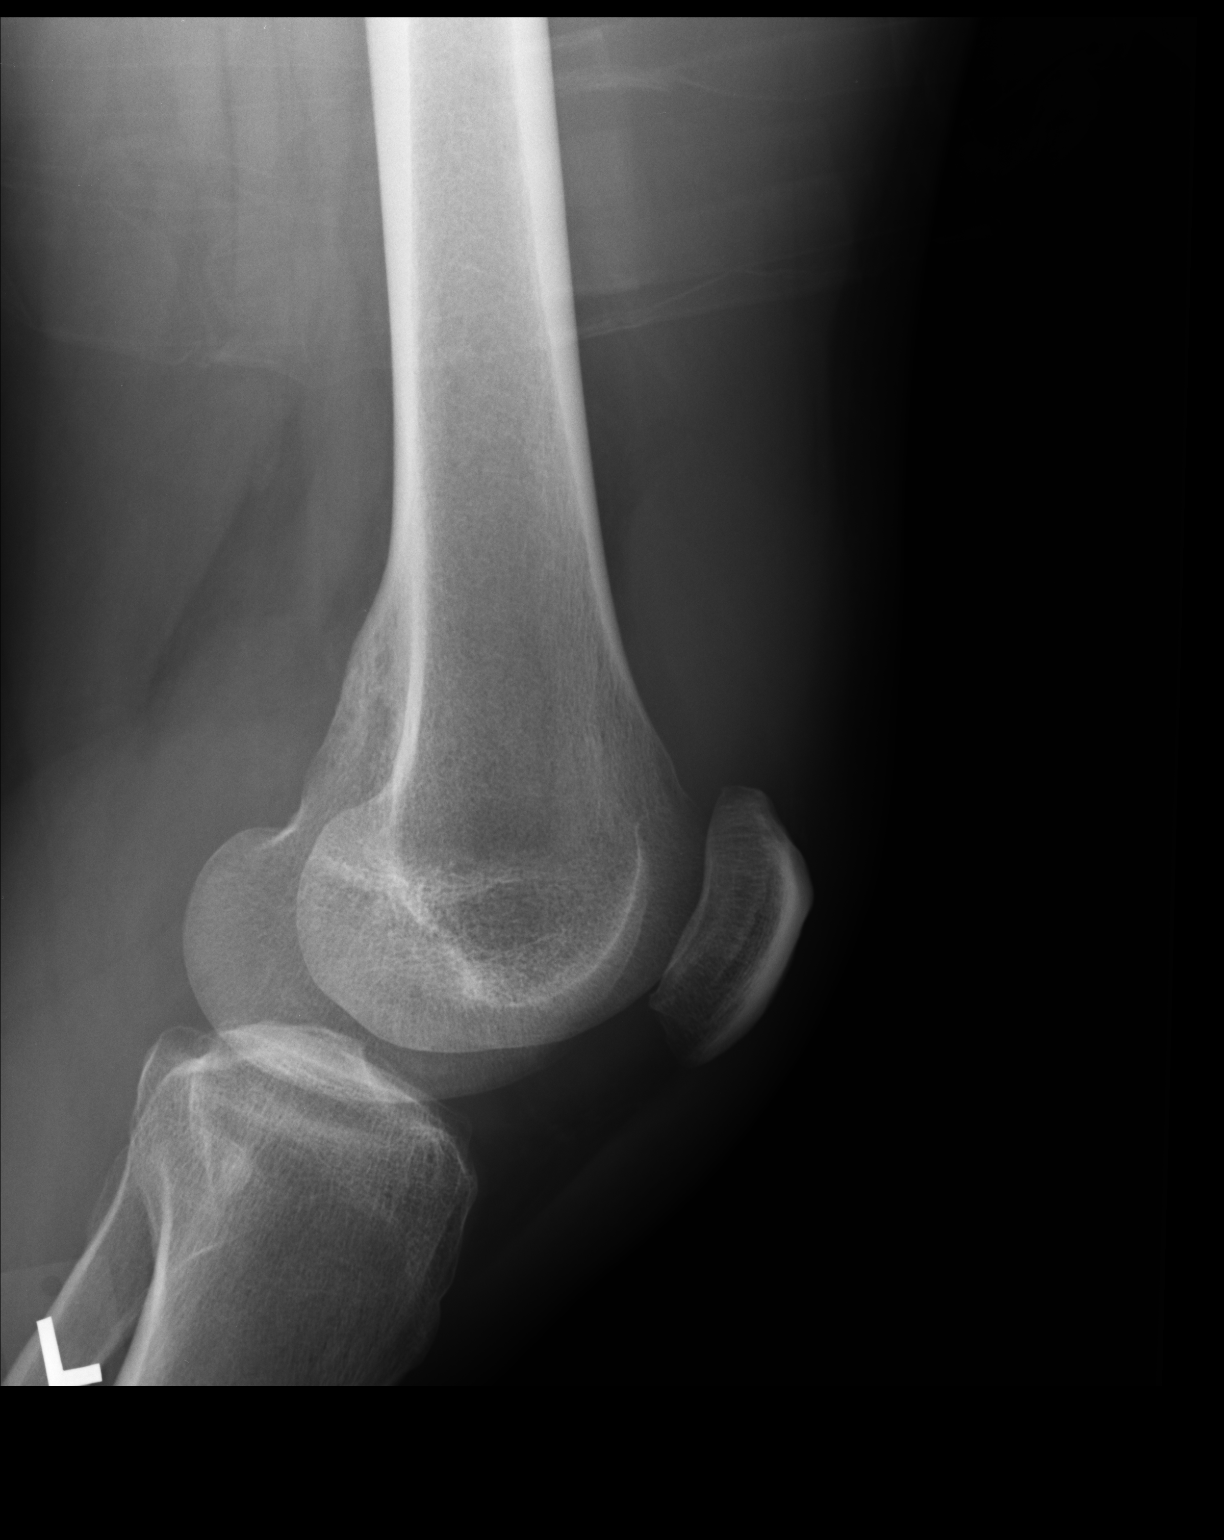

[ap ext rot]
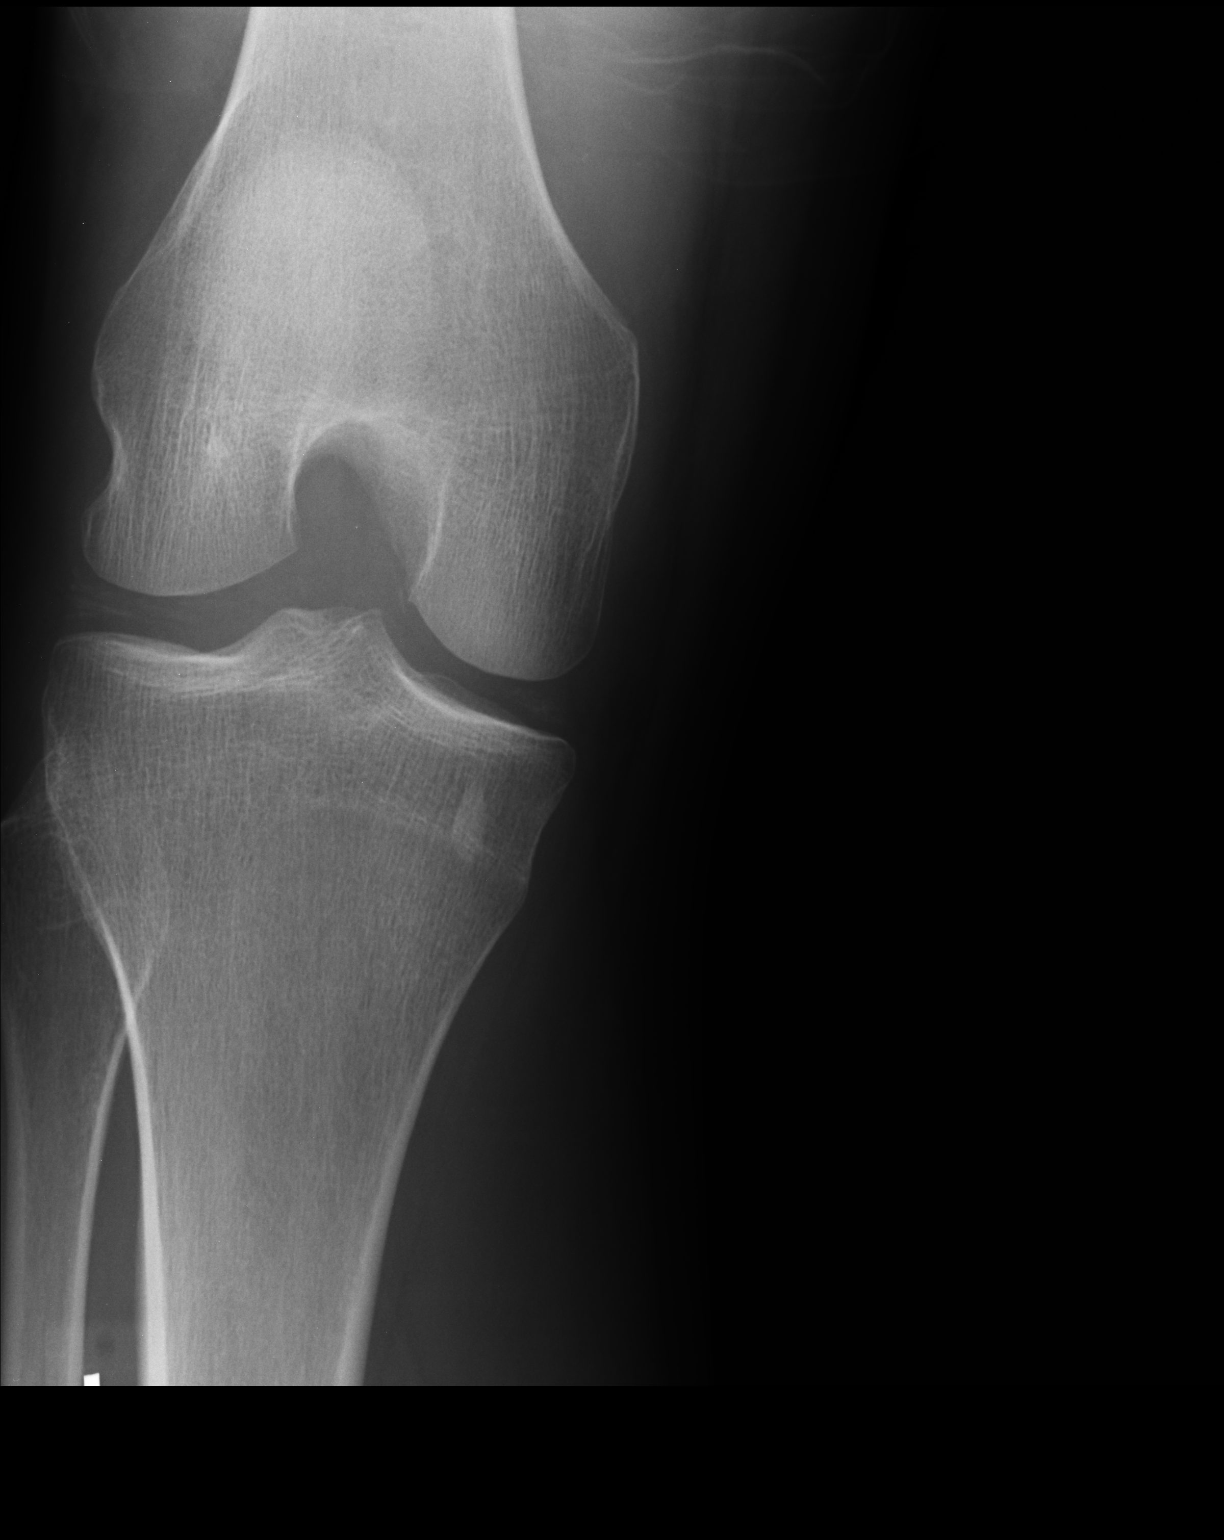

[3 of 3 positions shown; findings below may reference images not displayed]

FINDINGS: No fracture or dislocation is seen.

The joint spaces are preserved. Chondrocalcinosis.

Small suprapatellar knee joint effusion.

Stable benign sclerotic lesion in the proximal tibia.
IMPRESSION: No fracture or dislocation is seen.

Chondrocalcinosis.

Small suprapatellar knee joint effusion.

## 2013-03-01 ENCOUNTER — Ambulatory Visit (INDEPENDENT_AMBULATORY_CARE_PROVIDER_SITE_OTHER): Payer: BC Managed Care – PPO | Admitting: Emergency Medicine

## 2013-03-01 VITALS — BP 108/70 | HR 79 | Temp 98.2°F | Resp 18 | Ht 72.0 in | Wt 221.2 lb

## 2013-03-01 DIAGNOSIS — K219 Gastro-esophageal reflux disease without esophagitis: Secondary | ICD-10-CM

## 2013-03-01 DIAGNOSIS — M109 Gout, unspecified: Secondary | ICD-10-CM

## 2013-03-01 MED ORDER — MELOXICAM 15 MG PO TABS: 15.0000 mg | ORAL_TABLET | Freq: Every day | ORAL | Status: DC

## 2013-03-01 MED ORDER — PREDNISONE 10 MG PO KIT: PACK | ORAL | Status: DC

## 2013-03-01 NOTE — Patient Instructions (Signed)
Gout Gout Gout is an inflammatory arthritis caused by a buildup of uric acid crystals in the joints. Uric acid is a chemical that is normally present in the blood. When the level of uric acid in the blood is too high it can form crystals that deposit in your joints and tissues. This causes joint redness, soreness, and swelling (inflammation). Repeat attacks are common. Over time, uric acid crystals can form into masses (tophi) near a joint, destroying bone and causing disfigurement. Gout is treatable and often preventable. CAUSES  The disease begins with elevated levels of uric acid in the blood. Uric acid is produced by your body when it breaks down a naturally found substance called purines. Certain foods you eat, such as meats and fish, contain high amounts of purines. Causes of an elevated uric acid level include:  Being passed down from parent to child (heredity).  Diseases that cause increased uric acid production (such as obesity, psoriasis, and certain cancers).  Excessive alcohol use.  Diet, especially diets rich in meat and seafood.  Medicines, including certain cancer-fighting medicines (chemotherapy), water pills (diuretics), and aspirin.  Chronic kidney disease. The kidneys are no longer able to remove uric acid well.  Problems with metabolism. Conditions strongly associated with gout include:  Obesity.  High blood pressure.  High cholesterol.  Diabetes. Not everyone with elevated uric acid levels gets gout. It is not understood why some people get gout and others do not. Surgery, joint injury, and eating too much of certain foods are some of the factors that can lead to gout attacks. SYMPTOMS   An attack of gout comes on quickly. It causes intense pain with redness, swelling, and warmth in a joint.  Fever can occur.  Often, only one joint is involved. Certain joints are more commonly involved:  Base of the big toe.  Knee.  Ankle.  Wrist.  Finger. Without  treatment, an attack usually goes away in a few days to weeks. Between attacks, you usually will not have symptoms, which is different from many other forms of arthritis. DIAGNOSIS  Your caregiver will suspect gout based on your symptoms and exam. In some cases, tests may be recommended. The tests may include:  Blood tests.  Urine tests.  X-rays.  Joint fluid exam. This exam requires a needle to remove fluid from the joint (arthrocentesis). Using a microscope, gout is confirmed when uric acid crystals are seen in the joint fluid. TREATMENT  There are two phases to gout treatment: treating the sudden onset (acute) attack and preventing attacks (prophylaxis).  Treatment of an Acute Attack.  Medicines are used. These include anti-inflammatory medicines or steroid medicines.  An injection of steroid medicine into the affected joint is sometimes necessary.  The painful joint is rested. Movement can worsen the arthritis.  You may use warm or cold treatments on painful joints, depending which works best for you.  Treatment to Prevent Attacks.  If you suffer from frequent gout attacks, your caregiver may advise preventive medicine. These medicines are started after the acute attack subsides. These medicines either help your kidneys eliminate uric acid from your body or decrease your uric acid production. You may need to stay on these medicines for a very long time.  The early phase of treatment with preventive medicine can be associated with an increase in acute gout attacks. For this reason, during the first few months of treatment, your caregiver may also advise you to take medicines usually used for acute gout treatment. Be sure  you understand your caregiver's directions. Your caregiver may make several adjustments to your medicine dose before these medicines are effective.  Discuss dietary treatment with your caregiver or dietitian. Alcohol and drinks high in sugar and fructose and foods  such as meat, poultry, and seafood can increase uric acid levels. Your caregiver or dietician can advise you on drinks and foods that should be limited. HOME CARE INSTRUCTIONS   Do not take aspirin to relieve pain. This raises uric acid levels.  Only take over-the-counter or prescription medicines for pain, discomfort, or fever as directed by your caregiver.  Rest the joint as much as possible. When in bed, keep sheets and blankets off painful areas.  Keep the affected joint raised (elevated).  Apply warm or cold treatments to painful joints. Use of warm or cold treatments depends on which works best for you.  Use crutches if the painful joint is in your leg.  Drink enough fluids to keep your urine clear or pale yellow. This helps your body get rid of uric acid. Limit alcohol, sugary drinks, and fructose drinks.  Follow your dietary instructions. Pay careful attention to the amount of protein you eat. Your daily diet should emphasize fruits, vegetables, whole grains, and fat-free or low-fat milk products. Discuss the use of coffee, vitamin C, and cherries with your caregiver or dietician. These may be helpful in lowering uric acid levels.  Maintain a healthy body weight. SEEK MEDICAL CARE IF:   You develop diarrhea, vomiting, or any side effects from medicines.  You do not feel better in 24 hours, or you are getting worse. SEEK IMMEDIATE MEDICAL CARE IF:   Your joint becomes suddenly more tender, and you have chills or a fever. MAKE SURE YOU:   Understand these instructions.  Will watch your condition.  Will get help right away if you are not doing well or get worse. Document Released: 12/30/1999 Document Revised: 04/28/2012 Document Reviewed: 08/15/2011 [facility] Patient Information 2014 [place]Care, [place].

## 2013-03-01 NOTE — Progress Notes (Signed)
[facility] [address] [place] [address] [phone]  Date:  03/01/2013   Name:  Peter Ellis   DOB:  1960/07/20   MRN:  [id number]  PCP:  Lucilla Edin, MD    Chief Complaint: Gout   History of Present Illness:  Peter Ellis is a 53 y.o. very pleasant male patient who presents with the following:  Patient with a history of gout.  Difficult to manage due to his intolerance of naproxyn and colchicine.  Now has recurrent left wrist pain, swelling and redness.  Pain with use.  Says is characteristic of his usual gout flare.  Not on allopurinol.  Resistant to offerings of care.  Wants it managed "his way".  No history of injury or overuse.  No improvement with over the counter medications or other home remedies. Denies other complaint or health concern today.   Patient Active Problem List   Diagnosis Date Noted  . GERD (gastroesophageal reflux disease) 05/11/2011  . Gout 03/27/2011    Past Medical History  Diagnosis Date  . Gout   . GERD (gastroesophageal reflux disease)     No past surgical history on file.  History  Substance Use Topics  . Smoking status: Former Smoker    Quit date: 05/22/2010  . Smokeless tobacco: Not on file  . Alcohol Use: No    No family history on file.  Allergies  Allergen Reactions  . Colchicine [Colchicine] Other (See Comments)    Nausea and black stools  . Indomethacin Other (See Comments)    Gi problems  . Amoxicillin Rash  . Sulfa Antibiotics Rash    Medication list has been reviewed and updated.  Current Outpatient Prescriptions on File Prior to Visit  Medication Sig Dispense Refill  . allopurinol (ZYLOPRIM) 100 MG tablet Take 2 tablets (200 mg total) by mouth daily.  60 tablet  5  . doxycycline (VIBRA-TABS) 100 MG tablet Take 100 mg by mouth Twice daily.      . fluticasone (FLONASE) 50 MCG/ACT nasal spray Place 2 sprays into the nose daily.  16 g  6  . predniSONE (DELTASONE) 20 MG tablet Take two  daily for 5 days, then 1 tablet daily until gone (always take with food)  15 tablet  0  . promethazine (PHENERGAN) 25 MG tablet Take 1 tablet (25 mg total) by mouth every 8 (eight) hours as needed for nausea.  20 tablet  0  . ranitidine (ZANTAC) 150 MG tablet Take 1 tablet (150 mg total) by mouth 2 (two) times daily.  60 tablet  5  . sucralfate (CARAFATE) 1 GM/10ML suspension Take 10 mLs (1 g total) by mouth 4 (four) times daily.  420 mL  0   No current facility-administered medications on file prior to visit.    Review of Systems:  As per HPI, otherwise negative.    Physical Examination: Filed Vitals:   03/01/13 1107  BP: 108/70  Pulse: 79  Temp: 98.2 F (36.8 C)  Resp: 18   Filed Vitals:   03/01/13 1107  Height: 6' (1.829 m)  Weight: 221 lb 3.2 oz (100.336 kg)   Body mass index is 29.99 kg/(m^2). Ideal Body Weight: Weight in (lb) to have BMI = 25: 183.9   GEN: WDWN, NAD, Non-toxic, Alert & Oriented x 3 HEENT: Atraumatic, Normocephalic.  Ears and Nose: No external deformity. EXTR: No clubbing/cyanosis/edema NEURO: Normal gait.  PSYCH: Normally interactive. Conversant. Not depressed or anxious appearing.  Calm demeanor.  LEFT wrist:  Warm, red, tender.  Guards movement.   Assessment and Plan: Gout Prednisone mobic   Signed,  Phillips OdorJeffery Simran Mannis, MD

## 2013-03-04 ENCOUNTER — Telehealth: Payer: Self-pay

## 2013-03-04 NOTE — Telephone Encounter (Signed)
Dr Dareen PianoAnderson, Lorain ChildesFYI...[facility] wanted a 6 day or 12 day pack of prednisone for pt. I spoke w/Elizabeth who authorized a 6 day taper, which I faxed back to pharm.

## 2013-12-20 ENCOUNTER — Ambulatory Visit (INDEPENDENT_AMBULATORY_CARE_PROVIDER_SITE_OTHER): Payer: BC Managed Care – PPO | Admitting: Emergency Medicine

## 2013-12-20 VITALS — BP 144/84 | HR 78 | Temp 98.2°F | Resp 20 | Ht 71.5 in | Wt 228.4 lb

## 2013-12-20 DIAGNOSIS — L02416 Cutaneous abscess of left lower limb: Secondary | ICD-10-CM

## 2013-12-20 MED ORDER — CLINDAMYCIN HCL 300 MG PO CAPS: 300.0000 mg | ORAL_CAPSULE | Freq: Four times a day (QID) | ORAL | Status: DC

## 2013-12-20 NOTE — Progress Notes (Signed)
[facility] [address], [place] [address] [phone]  Date:  12/20/2013   Name:  Peter Ellis   DOB:  Dec 03, 1960   MRN:  [id number]  PCP:  Lucilla EdinAUB, STEVE A, MD    Chief Complaint: Knee Pain   History of Present Illness:  Peter SleeperGregory Ellis is a 53 y.o. very pleasant male patient who presents with the following:  No history of injury.  Has painful red swollen area anterior right knee. No fever or chills. History of prior episodes of cellulitis and abscess No improvement with over the counter medications or other home remedies.  Denies other complaint or health concern today.   Patient Active Problem List   Diagnosis Date Noted  . GERD (gastroesophageal reflux disease) 05/11/2011  . Gout 03/27/2011    Past Medical History  Diagnosis Date  . Gout   . GERD (gastroesophageal reflux disease)     History reviewed. No pertinent past surgical history.  History  Substance Use Topics  . Smoking status: Former Smoker    Quit date: 05/22/2010  . Smokeless tobacco: Not on file  . Alcohol Use: No    History reviewed. No pertinent family history.  Allergies  Allergen Reactions  . Colchicine [Colchicine] Other (See Comments)    Nausea and black stools  . Indomethacin Other (See Comments)    Gi problems  . Amoxicillin Rash  . Sulfa Antibiotics Rash    Medication list has been reviewed and updated.  Current Outpatient Prescriptions on File Prior to Visit  Medication Sig Dispense Refill  . dexlansoprazole (DEXILANT) 60 MG capsule Take 60 mg by mouth daily.    Marland Kitchen. allopurinol (ZYLOPRIM) 100 MG tablet Take 2 tablets (200 mg total) by mouth daily. 60 tablet 5  . fluticasone (FLONASE) 50 MCG/ACT nasal spray Place 2 sprays into the nose daily. 16 g 6  . promethazine (PHENERGAN) 25 MG tablet Take 1 tablet (25 mg total) by mouth every 8 (eight) hours as needed for nausea. 20 tablet 0  . ranitidine (ZANTAC) 150 MG tablet Take 1 tablet (150 mg total) by mouth  2 (two) times daily. 60 tablet 5  . sucralfate (CARAFATE) 1 GM/10ML suspension Take 10 mLs (1 g total) by mouth 4 (four) times daily. 420 mL 0   No current facility-administered medications on file prior to visit.    Review of Systems:  As per HPI, otherwise negative.    Physical Examination: Filed Vitals:   12/20/13 1444  BP: 144/84  Pulse: 78  Temp: 98.2 F (36.8 C)  Resp: 20   Filed Vitals:   12/20/13 1444  Height: 5' 11.5" (1.816 m)  Weight: 228 lb 6.4 oz (103.602 kg)   Body mass index is 31.41 kg/(m^2). Ideal Body Weight: Weight in (lb) to have BMI = 25: 181.4   GEN: WDWN, NAD, Non-toxic, Alert & Oriented x 3 HEENT: Atraumatic, Normocephalic.  Ears and Nose: No external deformity. EXTR: No clubbing/cyanosis/edema NEURO: Normal gait.  PSYCH: Normally interactive. Conversant. Not depressed or anxious appearing.  Calm demeanor.  LEFT knee:  Cellulitis anterior knee with two pustules.  Knee joint stable and not tender.  Assessment and Plan: Cellulitis knee Allergic to septra Clindamycin   Signed,  Phillips OdorJeffery Wilfred Dayrit, MD

## 2013-12-20 NOTE — Addendum Note (Signed)
Addended by: Carmelina DaneANDERSON, Justene Jensen S on: 12/20/2013 02:59 PM   Modules accepted: Level of Service

## 2013-12-20 NOTE — Patient Instructions (Signed)

## 2013-12-24 ENCOUNTER — Ambulatory Visit (INDEPENDENT_AMBULATORY_CARE_PROVIDER_SITE_OTHER): Payer: BC Managed Care – PPO | Admitting: Family Medicine

## 2013-12-24 VITALS — BP 123/82 | HR 92 | Temp 99.2°F | Resp 18 | Wt 226.0 lb

## 2013-12-24 DIAGNOSIS — M25562 Pain in left knee: Secondary | ICD-10-CM

## 2013-12-24 DIAGNOSIS — M25462 Effusion, left knee: Secondary | ICD-10-CM

## 2013-12-24 DIAGNOSIS — L02416 Cutaneous abscess of left lower limb: Secondary | ICD-10-CM

## 2013-12-24 NOTE — Progress Notes (Signed)
Subjective:  53 year old man who was treated here for days ago for a abscess on his left knee. Treated with clindamycin. He comes in taking he has a gout flare because the left knee is more swollen and hot and painful. He feels like the area of erythema with a cellulitis and abscess has decreased. He has a history of having gout in the past and he thought that this was probably a flare of his gout.  Objective: Right knee is visibly swollen. He has a abscess with an eschar on it, moderately red. There is a 8-10 cm area of erythema. There is a prepatellar fluctuance consistent with a prepatellar effusion. The joint itself is slightly swollen and palpation feels like there is fluid in through the joint space itself. It is tender along the joint line. The knee is hot to touch.  The physician who treated him 4 days ago examine this also. He states that there was no prepatellar or other effusion or significant swelling at the time of the previous exam.  Assessment: Right knee pain Right knee cellulitis and abscess Probable septic arthritis  Plan: Send patient to the emergency room. They will need to contact an orthopedist on call. Patient will probably need admission. Risk of introducing infection if not already infected, therefore knee was not aspirated at the office.

## 2013-12-24 NOTE — Patient Instructions (Signed)
[facility]. This will require an evaluation for a probable septic arthritis (infected knee joint). It is very important that you get immediate care to try to avoid permanent damage to the joint.

## 2013-12-25 ENCOUNTER — Telehealth: Payer: Self-pay | Admitting: *Deleted

## 2013-12-25 NOTE — Telephone Encounter (Signed)
LM for pt to go to the ED per Dr. Alwyn RenHopper instructions- Dr. Is concerned he potentially has a septic knee. Pt needs evaluation at ED today.

## 2014-04-14 ENCOUNTER — Ambulatory Visit (INDEPENDENT_AMBULATORY_CARE_PROVIDER_SITE_OTHER): Payer: BLUE CROSS/BLUE SHIELD | Admitting: Physician Assistant

## 2014-04-14 VITALS — BP 136/72 | HR 89 | Temp 98.0°F | Resp 18 | Ht 72.0 in | Wt 229.0 lb

## 2014-04-14 DIAGNOSIS — L03116 Cellulitis of left lower limb: Secondary | ICD-10-CM

## 2014-04-14 LAB — POCT CBC
Granulocyte percent: 64.2 %G (ref 37–80)
HCT, POC: 44.3 % (ref 43.5–53.7)
Hemoglobin: 14.4 g/dL (ref 14.1–18.1)
Lymph, poc: 1.6 (ref 0.6–3.4)
MCH, POC: 29.1 pg (ref 27–31.2)
MCHC: 32.6 g/dL (ref 31.8–35.4)
MCV: 89.2 fL (ref 80–97)
MID (cbc): 0.5 (ref 0–0.9)
MPV: 6.9 fL (ref 0–99.8)
PLATELET COUNT, POC: 297 10*3/uL (ref 142–424)
POC Granulocyte: 3.9 (ref 2–6.9)
POC LYMPH PERCENT: 26.7 %L (ref 10–50)
POC MID %: 9.1 %M (ref 0–12)
RBC: 4.96 M/uL (ref 4.69–6.13)
RDW, POC: 14.6 %
WBC: 6 10*3/uL (ref 4.6–10.2)

## 2014-04-14 MED ORDER — DOXYCYCLINE HYCLATE 100 MG PO CAPS: 100.0000 mg | ORAL_CAPSULE | Freq: Two times a day (BID) | ORAL | Status: DC

## 2014-04-14 MED ORDER — CEFTRIAXONE SODIUM 1 G IJ SOLR
1.0000 g | Freq: Once | INTRAMUSCULAR | Status: AC
  Administered 2014-04-14: 1 g via INTRAMUSCULAR

## 2014-04-14 NOTE — Patient Instructions (Signed)
You most likely have a skin infection over your left knee.  We still need to key an eye out to make sure it doesn't go deeper into the bursa over the knee. Please come back tomorrow so we can take a look at it. Please let us know sooner or go to the ER with fever, chills, more pus draining, or more pain in the area.  Please take the doxy twice daily for 10 days.

## 2014-04-14 NOTE — Progress Notes (Signed)
Subjective:    Patient ID: Peter Ellis, male    DOB: 25-Sep-1960, 54 y.o.   MRN: [id number]  Chief Complaint  Patient presents with  . Wound Infection    left leg x1 week drainage over the weekend    Patient Active Problem List   Diagnosis Date Noted  . GERD (gastroesophageal reflux disease) 05/11/2011  . Gout 03/27/2011   Prior to Admission medications   Medication Sig Start Date End Date Taking? Authorizing Provider  dexlansoprazole (DEXILANT) 60 MG capsule Take 60 mg by mouth daily.   Yes Historical Provider, MD  doxycycline (VIBRAMYCIN) 100 MG capsule Take 1 capsule (100 mg total) by mouth 2 (two) times daily. 04/14/14   Donnajean Lopesodd M Tyrae Alcoser, PA   Medications, allergies, past medical history, surgical history, family history, social history and problem list reviewed and updated.  HPI  89 yom with pmh gout and prev right knee cellulitis presents with one wk h/o left knee redness, swelling, tenderness.   Sx started approx one week ago with redness over left knee. Over next day started having tenderness overall left knee with touching over it. For past few days has noticed pus draining from front of knee, from the center of the redness. Denies pain while at rest. Denies pain with walking. Good rom. Only tender when touching it. Denies fever, chills.   Denies IVD use.   Seen here for similar complaint 12/24/13. Diag with cellulitis, started on clinda. Came back 4 days later and right knee was more red and swollen. Was instructed to go to ED for concern septic joint. Today he states he never went to the ED, just went home and pain/redness resolved. Attributes it to gout. Knee has been completely better from then until one wk ago.   Works in [employer]. Occasionally on knees while working, not often though. Does not wear knee pads.   Review of Systems No cp, sob, abd pain, unintentional wt loss, night sweats, n/v, diarrhea.     Objective:   Physical Exam  Constitutional: He is  oriented to person, place, and time. He appears well-developed and well-nourished.  Non-toxic appearance. He does not have a sickly appearance. He does not appear ill. No distress.  BP 136/72 mmHg  Pulse 89  Temp(Src) 98 F (36.7 C) (Oral)  Resp 18  Ht 6' (1.829 m)  Wt 229 lb (103.874 kg)  BMI 31.05 kg/m2  SpO2 99%   Cardiovascular: Normal rate, regular rhythm and normal heart sounds.   Pulses:      Dorsalis pedis pulses are 2+ on the left side.       Posterior tibial pulses are 2+ on the left side.  Musculoskeletal:       Right knee: Normal.       Left knee: He exhibits swelling, effusion and erythema. He exhibits normal range of motion and no laceration. No medial joint line and no lateral joint line tenderness noted.  Warmth over left knee.   Neurological: He is alert and oriented to person, place, and time.  Normal strength left ankle. Normal sensation LLE.   Skin:  Erythema over left knee. 1cm x 1cm necrotic tissue over patella. Mild amnt pus draining from center necrotic area. Swelling/effusion surrounding left knee. No significant swelling centered over prepatellar bursa. Normal rom. No pain with rom. General ttp over left knee.   Mild edema down left calf to just above ankle.   Psychiatric: He has a normal mood and affect. His speech is  normal and behavior is normal.   Results for orders placed or performed in visit on 04/14/14  POCT CBC  Result Value Ref Range   WBC 6.0 4.6 - 10.2 K/uL   Lymph, poc 1.6 0.6 - 3.4   POC LYMPH PERCENT 26.7 10 - 50 %L   MID (cbc) 0.5 0 - 0.9   POC MID % 9.1 0 - 12 %M   POC Granulocyte 3.9 2 - 6.9   Granulocyte percent 64.2 37 - 80 %G   RBC 4.96 4.69 - 6.13 M/uL   Hemoglobin 14.4 14.1 - 18.1 g/dL   HCT, POC 16.1 09.6 - 53.7 %   MCV 89.2 80 - 97 fL   MCH, POC 29.1 27 - 31.2 pg   MCHC 32.6 31.8 - 35.4 g/dL   RDW, POC 04.5 %   Platelet Count, POC 297 142 - 424 K/uL   MPV 6.9 0 - 99.8 fL      Assessment & Plan:   32 yom with pmh gout  and prev right knee cellulitis presents with one wk h/o left knee redness, swelling, tenderness.   Cellulitis of left knee - Plan: Wound culture, POCT CBC, cefTRIAXone (ROCEPHIN) injection 1 g, doxycycline (VIBRAMYCIN) 100 MG capsule --cellulitis over left knee with erythema, warmth, purulence, doubt septic arthritis with normal vitals, no leukocytosis, normal rom, no pain with rom or walking --possible septic bursitis with ttp over front knee, mild-mod swelling, and erythema over pre patellar bursa, though stronger suspicion for cellulitis as swelling/erythema/ttp not centered over bursa, more left knee generalized --no leukocytosis --1 gm rocephin today --doxy bid 10 days --rtc tomorrow for monitoring, rtc sooner/er with fevers, chills, worsening pain, expanding redness, worsening drainage  Donnajean Lopes, PA-C Physician Assistant-Certified [facility]  04/14/2014 12:12 PM

## 2014-04-15 ENCOUNTER — Ambulatory Visit (INDEPENDENT_AMBULATORY_CARE_PROVIDER_SITE_OTHER): Payer: BLUE CROSS/BLUE SHIELD | Admitting: Physician Assistant

## 2014-04-15 VITALS — BP 122/74 | HR 62 | Temp 98.2°F | Resp 17 | Ht 72.5 in | Wt 230.0 lb

## 2014-04-15 DIAGNOSIS — Z5189 Encounter for other specified aftercare: Secondary | ICD-10-CM

## 2014-04-15 DIAGNOSIS — L03116 Cellulitis of left lower limb: Secondary | ICD-10-CM

## 2014-04-15 NOTE — Progress Notes (Signed)
   Subjective:    Patient ID: Peter Ellis, male    DOB: 26-Jul-1960, 54 y.o.   MRN: [id number]  Chief Complaint  Patient presents with  . Wound Check   Patient Active Problem List   Diagnosis Date Noted  . GERD (gastroesophageal reflux disease) 05/11/2011  . Gout 03/27/2011   Prior to Admission medications   Medication Sig Start Date End Date Taking? Authorizing Provider  dexlansoprazole (DEXILANT) 60 MG capsule Take 60 mg by mouth daily.   Yes Historical Provider, MD  doxycycline (VIBRAMYCIN) 100 MG capsule Take 1 capsule (100 mg total) by mouth 2 (two) times daily. 04/14/14  Yes Huey Bienenstockodd M Kameron Blethen, PA   Medications, allergies, past medical history, surgical history, family history, social history and problem list reviewed and updated.  HPI  [age] yom returns to care for cellulitis check.   Seen here yest with warmth, erythema, small amnt purulence, swelling over left knee. No pain with rom. Full rom. Suspected cellulitis over left knee. Doubted septic bursitis with good rom and swelling/redness/pain was generalized and not centered over bursa.   Returns today stating knee feels better. Received rocephin yest. Started on doxy 10 days. Has been taking doxy as prescribed. Denies fevers, chills. States the knee is less tender to touch. Worked this am without issue.   Review of Systems See HPI.     Objective:   Physical Exam  Constitutional: He appears well-developed and well-nourished.  Non-toxic appearance. He does not have a sickly appearance. He does not appear ill. No distress.  BP 122/74 mmHg  Pulse 62  Temp(Src) 98.2 F (36.8 C) (Oral)  Resp 17  Ht 6' 0.5" (1.842 m)  Wt 230 lb (104.327 kg)  BMI 30.75 kg/m2  SpO2 98%   Musculoskeletal:  Erythema has regressed over left knee. Full rom left knee without pain. Mild generalized ttp. Mild amnt pus draining from middle of erythema. Mild edema that was present to mid calf yest has resolved. Normal strength, sensation left knee.  Normal cap refill.       Assessment & Plan:   [age] yom returns to care for cellulitis check.   Cellulitis of left knee Visit for wound check --erythema and swelling has regressed, full range of motion without pain, no fevers, chills, vitals good today --> reassuring that this is indeed cellulitis overlying the knee and not septic bursitis --continue doxy --keep area covered, wrapped while at work, kneepads if kneeling at work --rtc with fevers, chills, increased pus draining, increased redness, pain, increased swelling  Donnajean Lopesodd M. Kashius Dominic, PA-C Physician Assistant-Certified [facility]  04/15/2014 1:28 PM

## 2014-04-15 NOTE — Patient Instructions (Signed)
Your infection looks much better today than yesterday. The redness and swelling is less.  Please let us know if you have increasing pain, increasing redness, increasing pus draining out, or less range of motion with the knee. Please come back to see us if you're having fevers or chills.  Make sure to complete your entire antibiotic course.  I'll let you know when the culture comes back.

## 2014-04-16 LAB — WOUND CULTURE: GRAM STAIN: NONE SEEN

## 2014-10-19 ENCOUNTER — Encounter: Payer: Self-pay | Admitting: Emergency Medicine

## 2015-02-21 ENCOUNTER — Other Ambulatory Visit: Payer: Self-pay | Admitting: Physician Assistant

## 2015-06-07 ENCOUNTER — Ambulatory Visit (INDEPENDENT_AMBULATORY_CARE_PROVIDER_SITE_OTHER): Payer: Self-pay | Admitting: Family Medicine

## 2015-06-07 VITALS — BP 122/80 | HR 105 | Temp 97.8°F | Resp 18 | Ht 72.5 in | Wt 216.0 lb

## 2015-06-07 DIAGNOSIS — M7989 Other specified soft tissue disorders: Secondary | ICD-10-CM

## 2015-06-07 DIAGNOSIS — L02519 Cutaneous abscess of unspecified hand: Secondary | ICD-10-CM

## 2015-06-07 NOTE — Progress Notes (Signed)
By signing my name below, I, Mesha Guinyard, attest that this documentation has been prepared under the direction and in the presence of Meredith Staggers, MD.  Electronically Signed: Arvilla Market, Medical Scribe. 06/07/2015. 8:47 AM.  Subjective:    Patient ID: Peter Ellis, male    DOB: 11-May-1960, 55 y.o.   MRN: [id number]  HPI Chief Complaint  Patient presents with  . Hand Pain    LEFT HAND SWOLLEN AND INFECTED    HPI Comments: Keyshun Elpers is a 55 y.o. male who [facility] complaining of left hand edema and infection onset 4 days ago. Pt woke up to an abscess on his hand. Pt mentions he normally gets abscesses and squeezed some purulence out of infected site before he got to Urgent Care yesterday. Pt reports going to the Urgent Care on [address] in [place] to get it checked out and mentions the inflammation went down by the time he got there. Pt reports the provider numbed his hand and squeezed it so hard, part of his hand turned blue. The provider did a wound culture and prescribed him 100 mg of doxycycline BID, and bactroban cream to put on his hand. Pt states amount of swelling wasn't in his hand or lower arm yesterday - noted today - much more swollen.  Pt doesn't remember getting bit or stung by anything. Pt denies crawling in a basement or under a house, or other known spider exposure.  Pt denies pain in his lower arm, numbness in his fingers, or fever.  Patient Active Problem List   Diagnosis Date Noted  . GERD (gastroesophageal reflux disease) 05/11/2011  . Gout 03/27/2011   Past Medical History  Diagnosis Date  . Gout   . GERD (gastroesophageal reflux disease)    History reviewed. No pertinent past surgical history. Allergies  Allergen Reactions  . Colchicine [Colchicine] Other (See Comments)    Nausea and black stools  . Indomethacin Other (See Comments)    Gi problems  . Amoxicillin Rash  . Sulfa Antibiotics Rash   Prior  to Admission medications   Medication Sig Start Date End Date Taking? Authorizing Provider  dexlansoprazole (DEXILANT) 60 MG capsule Take 60 mg by mouth daily.    Historical Provider, MD  doxycycline (VIBRAMYCIN) 100 MG capsule Take 1 capsule (100 mg total) by mouth 2 (two) times daily. 04/14/14   Raelyn Ensign, PA   Social History   Social History  . Marital Status: Divorced    Spouse Name: N/A  . Number of Children: N/A  . Years of Education: N/A   Occupational History  . Not on file.   Social History Main Topics  . Smoking status: Former Smoker    Quit date: 05/22/2010  . Smokeless tobacco: Not on file  . Alcohol Use: No  . Drug Use: No  . Sexual Activity: Not on file   Other Topics Concern  . 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 He has a twin brother.   Review of Systems  Constitutional: Negative for fever.  Musculoskeletal: Negative for myalgias and arthralgias.  Skin: Positive for wound.  Neurological: Negative for numbness.   Objective:  BP 122/80 mmHg  Pulse 105  Temp(Src) 97.8 F (36.6 C) (Oral)  Resp 18  Ht 6' 0.5" (1.842 m)  Wt 216 lb (97.977 kg)  BMI 28.88 kg/m2  SpO2 100%  Physical Exam  Constitutional: He  appears well-developed and well-nourished. No distress.  HENT:  Head: Normocephalic and atraumatic.  Eyes: Conjunctivae are normal.  Neck: Neck supple.  Cardiovascular: Normal rate.   Pulmonary/Chest: Effort normal.  Musculoskeletal:  NVI distally with cap refill less than 1 sec Left hand swelling diffusely Diffuse swelling into left hand all the way to mid forearm. Radial pulse intact Unable to make a fist due to soft tissue swelling  Neurological: He is alert.  Skin: Skin is warm and dry. There is erythema.  Erythema around the wound extends out approximately 5cm X 7cm Wound on dorsal of hand over 4th to 5th metacarpal area with significant induration from the lateral hand dorsal wrist  to the lateral thumb central Wound approximately 1 cm across with approximately 5 mm of white overlying skin around central wound  Psychiatric: He has a normal mood and affect. His behavior is normal.  Nursing note and vitals reviewed.    Assessment & Plan:  Peter SleeperGregory Easler is a 55 y.o. male Abscess of hand  Left arm swelling  Swelling of left hand  Abscess versus insect or spider bite on dorsum of left hand. Now 4 days out from initial wound, expressed pus on his own yesterday followed by evaluation at other urgent care yesterday. Apparently a wound culture was obtained, marked pressure to the wound to try to express pus, and Bactroban, doxycycline started. Significantly more swelling into the hand and forearm today. NVI distally, but due to marked amount of swelling, concern for deeper abscess versus inflammation from initial wound.  -evaluate by orthopedics/hand surgery today if possible. Appointment pending at the time of his visit, we'll call with details. Continue doxycycline and Bactroban for now. Bandage applied.   Discussed with hand surgeon/ortho. ecommended emergency room evaluation if he does have significant increase in swelling as possible abscess and need for OR evaluation.  4:02 PM: Called patient. His pain has actually improved and feels less sore throughout the hand, able to bend his hand and grip easier throughout the day. May be more inflammation or swelling from pressure yesterday versus deeper abscess/infection.   Discussed emergency room evaluation if persistent or worsening swelling tonight, but as he is improving, plan to recheck tomorrow morning to see if swelling has improved or need for hand surgeon evaluation at that time. ER precautions overnight discussed and understanding expressed.   No orders of the defined types were placed in this encounter.   Patient Instructions       IF you received an x-ray today, you will receive an invoice from [facility]. Please contact [facility] at [phone] with questions or concerns regarding your invoice.   IF you received labwork today, you will receive an invoice from [facility]. Please contact [facility] at [phone] with questions or concerns regarding your invoice.   Our billing staff will not be able to assist you with questions regarding bills from these companies.  You will be contacted with the lab results as soon as they are available. The fastest way to get your results is to activate your My Chart account. Instructions are located on the last page of this paperwork. If you have not heard from us regarding the results in 2 weeks, please contact this office.    Continue the Bactroban ointment and doxycycline as prescribed yesterday. I'm trying to get you into see a hand surgeon today due to the amount of swelling. Our office will call you this morning.     I personally performed  the services described in this documentation, which was scribed in my presence. The recorded information has been reviewed and considered, and addended by me as needed.

## 2015-06-07 NOTE — Patient Instructions (Addendum)
     IF you received an x-ray today, you will receive an invoice from [facility]. Please contact [facility] at [phone] with questions or concerns regarding your invoice.   IF you received labwork today, you will receive an invoice from [facility]. Please contact [facility] at [phone] with questions or concerns regarding your invoice.   Our billing staff will not be able to assist you with questions regarding bills from these companies.  You will be contacted with the lab results as soon as they are available. The fastest way to get your results is to activate your My Chart account. Instructions are located on the last page of this paperwork. If you have not heard from us regarding the results in 2 weeks, please contact this office.    Continue the Bactroban ointment and doxycycline as prescribed yesterday. I'm trying to get you into see a hand surgeon today due to the amount of swelling. Our office will call you this morning.

## 2015-06-08 ENCOUNTER — Ambulatory Visit (INDEPENDENT_AMBULATORY_CARE_PROVIDER_SITE_OTHER): Payer: BLUE CROSS/BLUE SHIELD | Admitting: Physician Assistant

## 2015-06-08 VITALS — BP 140/82 | HR 105 | Temp 97.2°F | Resp 16 | Ht 72.0 in | Wt 214.0 lb

## 2015-06-08 DIAGNOSIS — L02519 Cutaneous abscess of unspecified hand: Secondary | ICD-10-CM

## 2015-06-08 DIAGNOSIS — M7989 Other specified soft tissue disorders: Secondary | ICD-10-CM | POA: Diagnosis not present

## 2015-06-08 NOTE — Progress Notes (Signed)
   06/08/2015 11:15 AM   DOB: October 19, 1960 / MRN: [id number]  SUBJECTIVE:  [name] is a well appearing 55 y.o. here today for wound care. He noticed an abscess on his posterior aspect of his left hand roughly 5 days ago.  He was seen at an outside facility and was given doxy of which he is taking.  He saw Dr. Neva SeatGreene yesterday and advised he stay the course and return for recheck today.  Today he reports he is 50% better than at his worst.  His is no able to close his hand and has seen the rash that was moving up his arm resolve.  He denies fever, nausea, and is tolerating Doxy without difficulty.   He is allergic to colchicine; indomethacin; amoxicillin; and sulfa antibiotics.   He  has a past medical history of Gout and GERD (gastroesophageal reflux disease).    He  reports that he quit smoking about 5 years ago. He does not have any smokeless tobacco history on file. He reports that he does not drink alcohol or use illicit drugs. He  has no sexual activity history on file. The patient  has no past surgical history on file.  His family history is not on file.  Review of Systems  Constitutional: Negative for fever and chills.  Gastrointestinal: Negative for nausea.  Skin: Positive for rash. Negative for itching.  Neurological: Positive for dizziness. Negative for headaches.    Problem list and medications reviewed and updated by myself where necessary, and exist elsewhere in the encounter.   OBJECTIVE:  BP 140/82 mmHg  Pulse 105  Temp(Src) 97.2 F (36.2 C) (Oral)  Resp 16  Ht 6' (1.829 m)  Wt 214 lb (97.07 kg)  BMI 29.02 kg/m2  SpO2 99% CrCl cannot be calculated (Patient has no serum creatinine result on file.).  Physical Exam  Constitutional: He is oriented to person, place, and time. He appears well-developed. He does not appear ill.  Eyes: Conjunctivae and EOM are normal. Pupils are equal, round, and reactive to light.  Cardiovascular: Normal rate, regular rhythm and normal  heart sounds.   Pulmonary/Chest: Effort normal and breath sounds normal.  Abdominal: He exhibits no distension.  Musculoskeletal: Normal range of motion.       Hands: Neurological: He is alert and oriented to person, place, and time. No cranial nerve deficit. Coordination normal.  Skin: Skin is warm and dry. He is not diaphoretic.  Psychiatric: He has a normal mood and affect.  Nursing note and vitals reviewed.   No results found for this or any previous visit (from the past 48 hour(s)).  ASSESSMENT AND PLAN  Peter Ellis was seen today for follow-up.  Diagnoses and all orders for this visit:  Swelling of left hand: This appears to be resolving.  Advised he return on Friday for a recheck, sooner if worse. Advised he continue the doxy.    Abscess of hand    The patient was advised to call or return to clinic if he does not see an improvement in symptoms or to seek the care of the closest emergency department if he worsens with the above plan.   Peter Ellis, MHS, PA-C [facility] 06/08/2015 11:15 AM

## 2015-06-08 NOTE — Patient Instructions (Signed)
     IF you received an x-ray today, you will receive an invoice from [facility]. Please contact [facility] at [phone] with questions or concerns regarding your invoice.   IF you received labwork today, you will receive an invoice from [facility]. Please contact [facility] at [phone] with questions or concerns regarding your invoice.   Our billing staff will not be able to assist you with questions regarding bills from these companies.  You will be contacted with the lab results as soon as they are available. The fastest way to get your results is to activate your My Chart account. Instructions are located on the last page of this paperwork. If you have not heard from us regarding the results in 2 weeks, please contact this office.      

## 2015-06-10 ENCOUNTER — Ambulatory Visit (INDEPENDENT_AMBULATORY_CARE_PROVIDER_SITE_OTHER): Payer: BLUE CROSS/BLUE SHIELD | Admitting: Physician Assistant

## 2015-06-10 VITALS — BP 126/84 | HR 75 | Temp 98.1°F | Resp 16

## 2015-06-10 DIAGNOSIS — L02519 Cutaneous abscess of unspecified hand: Secondary | ICD-10-CM | POA: Diagnosis not present

## 2015-06-10 NOTE — Patient Instructions (Signed)
     IF you received an x-ray today, you will receive an invoice from [facility]. Please contact [facility] at [phone] with questions or concerns regarding your invoice.   IF you received labwork today, you will receive an invoice from [facility]. Please contact [facility] at [phone] with questions or concerns regarding your invoice.   Our billing staff will not be able to assist you with questions regarding bills from these companies.  You will be contacted with the lab results as soon as they are available. The fastest way to get your results is to activate your My Chart account. Instructions are located on the last page of this paperwork. If you have not heard from us regarding the results in 2 weeks, please contact this office.      

## 2015-06-10 NOTE — Progress Notes (Signed)
   06/10/2015 8:54 AM   DOB: 1960-10-20 / MRN: [id number]  SUBJECTIVE:  [name] is a well appearing 55 y.o. here today for wound care. He noticed an abscess on his posterior aspect of his left hand roughly 7 days ago.  He was seen at an outside facility and was given doxy of which he is taking.  ay.  Today he reports he is 50% better than at his worst.  His is no able to close his hand and has seen the rash that was moving up his arm resolve.  He denies fever, nausea, and is tolerating Doxy without difficulty.   He is allergic to colchicine; indomethacin; amoxicillin; and sulfa antibiotics.   He  has a past medical history of Gout and GERD (gastroesophageal reflux disease).    He  reports that he quit smoking about 5 years ago. He does not have any smokeless tobacco history on file. He reports that he does not drink alcohol or use illicit drugs. He  has no sexual activity history on file. The patient  has no past surgical history on file.  His family history is not on file.  Review of Systems  Constitutional: Negative for fever and chills.  Gastrointestinal: Negative for nausea.  Skin: Positive for rash.    Problem list and medications reviewed and updated by myself where necessary, and exist elsewhere in the encounter.   OBJECTIVE:  BP 126/84 mmHg  Pulse 75  Temp(Src) 98.1 F (36.7 C) (Oral)  Resp 16  SpO2 99% CrCl cannot be calculated (Patient has no serum creatinine result on file.).  Physical Exam  Constitutional: He is oriented to person, place, and time. He appears well-developed. He does not appear ill.  Eyes: Conjunctivae and EOM are normal. Pupils are equal, round, and reactive to light.  Cardiovascular: Normal rate, regular rhythm and normal heart sounds.   Pulmonary/Chest: Effort normal and breath sounds normal.  Abdominal: He exhibits no distension.  Musculoskeletal: Normal range of motion.       Hands: Neurological: He is alert and oriented to person, place, and  time. No cranial nerve deficit. Coordination normal.  Skin: Skin is warm and dry. He is not diaphoretic.  Psychiatric: He has a normal mood and affect.  Nursing note and vitals reviewed.   No results found for this or any previous visit (from the past 48 hour(s)).  ASSESSMENT AND PLAN  Peter Ellis was seen today for follow-up.  Diagnoses and all orders for this visit:    Abscess of hand: The wound is definitely responding to Doxy.  He will continue this until next Wednesday which I think will be sufficient time to eradicate infection.  Follow up prn or sooner if worsening.     The patient was advised to call or return to clinic if he does not see an improvement in symptoms or to seek the care of the closest emergency department if he worsens with the above plan.   Deliah BostonMichael Pollie Poma, MHS, PA-C [facility] 06/10/2015 8:54 AM

## 2023-05-23 ENCOUNTER — Other Ambulatory Visit: Payer: Self-pay

## 2023-05-23 ENCOUNTER — Emergency Department (HOSPITAL_COMMUNITY)
Admission: EM | Admit: 2023-05-23 | Discharge: 2023-05-23 | Disposition: A | Discharge status: Home / Self Care | Attending: Emergency Medicine | Admitting: Emergency Medicine

## 2023-05-23 ENCOUNTER — Encounter (HOSPITAL_COMMUNITY): Payer: Self-pay | Admitting: Emergency Medicine

## 2023-05-23 ENCOUNTER — Emergency Department (HOSPITAL_COMMUNITY)

## 2023-05-23 DIAGNOSIS — H538 Other visual disturbances: Secondary | ICD-10-CM | POA: Diagnosis present

## 2023-05-23 DIAGNOSIS — H4710 Unspecified papilledema: Secondary | ICD-10-CM | POA: Diagnosis not present

## 2023-05-23 DIAGNOSIS — G08 Intracranial and intraspinal phlebitis and thrombophlebitis: Secondary | ICD-10-CM | POA: Insufficient documentation

## 2023-05-23 HISTORY — DX: Cerebral infarction, unspecified: I63.9

## 2023-05-23 LAB — BASIC METABOLIC PANEL WITH GFR
Anion gap: 10 (ref 5–15)
BUN: 8 mg/dL (ref 8–23)
CO2: 24 mmol/L (ref 22–32)
Calcium: 9.5 mg/dL (ref 8.9–10.3)
Chloride: 104 mmol/L (ref 98–111)
Creatinine, Ser: 1.23 mg/dL (ref 0.61–1.24)
GFR, Estimated: >60 mL/min (ref >60)
Glucose, Bld: 90 mg/dL (ref 70–99)
Potassium: 3.9 mmol/L (ref 3.5–5.1)
Sodium: 138 mmol/L (ref 135–145)

## 2023-05-23 LAB — CBC WITH DIFFERENTIAL/PLATELET
Abs Immature Granulocytes: 0.08 10*3/uL — ABNORMAL HIGH (ref 0.00–0.07)
Basophils Absolute: 0 10*3/uL (ref 0.0–0.1)
Basophils Relative: 0 %
Eosinophils Absolute: 0.5 10*3/uL (ref 0.0–0.5)
Eosinophils Relative: 5 %
HCT: 53.6 % — ABNORMAL HIGH (ref 39.0–52.0)
Hemoglobin: 17.6 g/dL — ABNORMAL HIGH (ref 13.0–17.0)
Immature Granulocytes: 1 %
Lymphocytes Relative: 23 %
Lymphs Abs: 2.2 10*3/uL (ref 0.7–4.0)
MCH: 29.9 pg (ref 26.0–34.0)
MCHC: 32.8 g/dL (ref 30.0–36.0)
MCV: 91 fL (ref 80.0–100.0)
Monocytes Absolute: 0.9 10*3/uL (ref 0.1–1.0)
Monocytes Relative: 10 %
Neutro Abs: 5.9 10*3/uL (ref 1.7–7.7)
Neutrophils Relative %: 61 %
Platelets: 247 10*3/uL (ref 150–400)
RBC: 5.89 MIL/uL — ABNORMAL HIGH (ref 4.22–5.81)
RDW: 13.3 % (ref 11.5–15.5)
WBC: 9.7 10*3/uL (ref 4.0–10.5)
nRBC: 0 % (ref 0.0–0.2)

## 2023-05-23 LAB — C-REACTIVE PROTEIN: CRP: 0.5 mg/dL (ref <1.0)

## 2023-05-23 LAB — SEDIMENTATION RATE: Sed Rate: 3 mm/h (ref 0–16)

## 2023-05-23 MED ORDER — GADOBUTROL 1 MMOL/ML IV SOLN
10.0000 mL | Freq: Once | INTRAVENOUS | Status: AC | PRN
  Administered 2023-05-23: 10 mL via INTRAVENOUS

## 2023-05-23 MED ORDER — ACETAZOLAMIDE 250 MG PO TABS
ORAL_TABLET | ORAL | 0 refills | Status: DC
Start: 2023-05-23 — End: 2023-05-23

## 2023-05-23 MED ORDER — ACETAZOLAMIDE 250 MG PO TABS: ORAL_TABLET | ORAL | 0 refills | Status: AC

## 2023-05-23 NOTE — ED Notes (Signed)
 Patient transported to MRI

## 2023-05-23 NOTE — ED Provider Notes (Signed)
 Assume Care - Medical Decision Making  Care of patient assumed from previous emergency medicine provider. See their note for further details of history, physical exam and plan.  Briefly, Peter Ellis is a 63 y.o. male who presents as below:  Clinical Course as of 05/23/23 2328  Thu May 23, 2023  1447 S; visual changes and diplopia; 1 month ago had R arm weakness and visual changes -> had a dural sinus venous thrombosis and stroke -> on [facility]; seen eye doctor today with worsening papilledema; ophtho sent to ER for MRI and w/u; we discussed with neuro here; MRI head, MRV head; if dSVT then we don't need to worry about IIH; if MRI negative then discuss with neuro about admission for LP given he is on [facility] [[facility]]    Clinical Course User Index [WC] Arminda Landmark, MD    Reassessment:  I personally reassessed the patient: Vital Signs:  The most current vitals were:  Vitals:   05/23/23 1945 05/23/23 1946  BP: (!) 123/99   Pulse: 87   Resp: 17   Temp:  98.7 F (37.1 C)  SpO2: 99%     Neuro Exam  Mental Status Exam Alert and oriented Memory appropriate  Speech Speech is clear No dysarthria Language is normal  Cranial Nerves CN II: Visual fields intact to confrontation CN III, IV, VI: PERRL, EOMI, No nystagmus CN V: Facial sensation is normal and equal CN VII: No facial weakness or asymmetry CN VIII: Auditory acuity grossly normal CN IX and X: Uvula is midline, palate elevates symmetrically CN XI: Normal sternocleidomastoid and equal strength CN XII: The tongue is midline, no tongue atrophy or fasciculations  Motor Muscle Strength RUE: 5/5 flexion and extension RLE: 5/5 flexion and extension LUE: 5/5 flexion and extension LLE: 5/5 flexion and extension No pronation or drift  Muscle Tone Normal bulk and tone  Coordination No tremor  Sensation Intact to light touch  Gait Routine gait normal   Hemodynamics:  The patient is hemodynamically stable. 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  Neurology evaluate the patient the bedside feel the patient is cleared for discharge.  We instructed patient to continue anticoagulation medication.  Diamox  was prescribed in accordance to neurological recommendations.  Patient has ophthalmology follow-up appointment in 1 month.  No further emergent inventions.  Plan was discussed.  Neuroexam at time of discharge reassuring.   Arminda Landmark, MD 05/23/23 9562    Almond Army, MD 05/24/23 1351

## 2023-05-23 NOTE — ED Provider Notes (Signed)
 [place] EMERGENCY DEPARTMENT AT [facility] Provider Note   CSN: [id number] Arrival date & time: 05/23/23  1103     History  No chief complaint on file.   Peter Ellis is a 63 y.o. male.  The history is provided by the patient and medical records. No language interpreter was used.     63 year old male sent here by ophthalmologist for further evaluation of vision changes.  Patient report a month ago he developed some intermittent tingling and numbness sensation to his left arm.  He was evaluated in the hospital at [facility] and subsequently diagnosed with a mild stroke.  He was started on Plavix.  He also did report having some double vision and therefore he did follow-up with ophthalmologist outpatient.  He was seen by the ophthalmologist 2 days ago for his follow-up and they have instructed patient to go to the ER for further evaluation of his condition.  Ophthalmologist recommend patient to have a lumbar puncture and measuring opening pressure as well as CRP, ESR, CBC with differential, MRI with and without contrast of the orbits and brain to rule out intracranial hypertension and suspicion of temporal arteritis.  Otherwise at this time patient without any acute complaint.  States that he has some very minimal pressure headache.  Does not endorse any fever chills chest pain shortness of breath new focal numbness or focal weakness.  Home Medications Prior to Admission medications   Medication Sig Start Date End Date Taking? Authorizing Provider  dexlansoprazole (DEXILANT) 60 MG capsule Take 60 mg by mouth daily.    [provider]  doxycycline  (VIBRAMYCIN ) 100 MG capsule Take 1 capsule (100 mg total) by mouth 2 (two) times daily. 04/14/14   McVeigh, Ena Harries, PA      Allergies    Colchicine  [colchicine ], Indomethacin, Amoxicillin, and Sulfa antibiotics    Review of Systems   Review of Systems  All other systems reviewed and are negative.   Physical  Exam Updated Vital Signs BP (!) 159/109 (BP Location: Right Arm)   Pulse 95   Temp 97.6 F (36.4 C)   Resp 18   SpO2 99%  Physical Exam Constitutional:      General: He is not in acute distress.    Appearance: He is well-developed.  HENT:     Head: Normocephalic and atraumatic.  Eyes:     Extraocular Movements: Extraocular movements intact.     Conjunctiva/sclera: Conjunctivae normal.     Pupils: Pupils are equal, round, and reactive to light.  Cardiovascular:     Rate and Rhythm: Normal rate and regular rhythm.     Pulses: Normal pulses.     Heart sounds: Normal heart sounds.  Pulmonary:     Effort: Pulmonary effort is normal.     Breath sounds: Normal breath sounds.  Abdominal:     Palpations: Abdomen is soft.  Musculoskeletal:     Cervical back: Normal range of motion and neck supple. No rigidity.  Skin:    Findings: No rash.  Neurological:     Mental Status: He is alert.     GCS: GCS eye subscore is 4. GCS verbal subscore is 5. GCS motor subscore is 6.     Cranial Nerves: Cranial nerves 2-12 are intact.     Sensory: Sensation is intact.     Motor: Motor function is intact.     ED Results / Procedures / Treatments   Labs (all labs ordered are listed, but only abnormal results are displayed)  Labs Reviewed  CBC WITH DIFFERENTIAL/PLATELET - Abnormal; Notable for the following components:      Result Value   RBC 5.89 (*)    Hemoglobin 17.6 (*)    HCT 53.6 (*)    Abs Immature Granulocytes 0.08 (*)    All other components within normal limits  BASIC METABOLIC PANEL WITH GFR  SEDIMENTATION RATE  C-REACTIVE PROTEIN    EKG None  Radiology No results found.  Procedures Procedures    Medications Ordered in ED Medications - No data to display  ED Course/ Medical Decision Making/ A&P Clinical Course as of 05/23/23 1509  Thu May 23, 2023  1447 S; visual changes and diplopia; 1 month ago had R arm weakness and visual changes -> had a dural sinus venous  thrombosis and stroke -> on [facility]; seen eye doctor today with worsening papilledema; ophtho sent to ER for MRI and w/u; we discussed with neuro here; MRI head, MRV head; if dSVT then we don't need to worry about IIH; if MRI negative then discuss with neuro about admission for LP given he is on [facility] [[facility]]    Clinical Course User Index [WC] Arminda Landmark, MD                                 Medical Decision Making Amount and/or Complexity of Data Reviewed Labs: ordered. Radiology: ordered.   BP (!) 159/109 (BP Location: Right Arm)   Pulse 95   Temp 97.6 F (36.4 C)   Resp 18   Ht 5\' 11"  (1.803 m)   Wt 95.3 kg   SpO2 99%   BMI 29.29 kg/m   27:88 AM  63 year old male sent here by ophthalmologist for further evaluation of vision changes.  Patient report a month ago he developed some intermittent tingling and numbness sensation to his left arm.  He was evaluated in the hospital at [facility] and subsequently diagnosed with a mild stroke.  He was started on Plavix.  He also did report having some double vision and therefore he did follow-up with ophthalmologist outpatient.  He was seen by the ophthalmologist 2 days ago for his follow-up and they have instructed patient to go to the ER for further evaluation of his condition.  Ophthalmologist recommend patient to have a lumbar puncture and measuring opening pressure as well as CRP, ESR, CBC with differential, MRI with and without contrast of the orbits and brain to rule out intracranial hypertension and suspicion of temporal arteritis.  Otherwise at this time patient without any acute complaint.  States that he has some very minimal pressure headache.  Does not endorse any fever chills chest pain shortness of breath new focal numbness or focal weakness.  On exam patient is wearing corrective glasses.  He is without any focal neurodeficit.  No nuchal rigidity noted.  He is mentating appropriately.  Patient brought in a note from ophthalmology  office with the specific labs request.  I have reached out and consulted on-call neurologist Dr. Lynell Sar who have reviewed pt's prior work up and felt his sxs likely 2/2 to recent diagnosis of dural sinus venous thrombosis which can cause diplopia, 6th nerve palsy and headache.   Neurologist does not think Lumbar puncture and repeat MRI is indicated at this time.  He also recommend MR V head as well as dural venous sinus thrombosis should be reevaluated as LP is not likely helpful in that setting.  3:16 PM Pt sign out to oncoming team who will f/u with MRI result and determine disposition.         Final Clinical Impression(s) / ED Diagnoses Final diagnoses:  None    Rx / DC Orders ED Discharge Orders     None         Debbra Fairy, PA-C 05/23/23 1517    Albertus Hughs, DO 05/23/23 1545
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
## 2023-05-23 NOTE — Discharge Instructions (Addendum)
 Peter Ellis:  Thank you for allowing us  to take care of you today.  We hope you begin feeling better soon. You were seen today for papilledema.  MRI also performed which on review of neurology and radiology appear that your clots are getting better.  Please continue taking your blood thinner.  We have prescribed Diamox to your pharmacy.  To-Do:  Please follow-up with your primary doctor within the next 2-3 days. It is important that you review any labs or imaging results (if any) that you had today with them. Your preliminary imaging results (if any) are attached. Please return to the Emergency Department or call 911 if you experience chest pain, shortness of breath, severe pain, severe fever, altered mental status, or have any reason to think that you need emergency medical care.  Thank you again.  Hope you feel better soon.  Arminda Landmark, MD Department of Emergency Medicine

## 2023-05-23 NOTE — ED Triage Notes (Signed)
 Pt POV from ophthalmologist office due to needing MRI.  Pt had a stroke one month ago that affected his vision and has a new onset of headaches.  Pt given paperwork by ophthalmologist to give to EDP.
